# Patient Record
Sex: Male | Born: 1985 | Race: White | Hispanic: No | Marital: Single | Smoking: Current some day smoker
Health system: Southern US, Community
[De-identification: ages and names within clinical notes are randomized; demographics above are authoritative.]

## PROBLEM LIST (undated history)

## (undated) DIAGNOSIS — F1910 Other psychoactive substance abuse, uncomplicated: Secondary | ICD-10-CM

## (undated) HISTORY — PX: KIDNEY SURGERY: SHX687

## (undated) HISTORY — PX: HAND SURGERY: SHX662

---
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
## 2008-06-11 ENCOUNTER — Encounter: Payer: Self-pay | Admitting: Emergency Medicine

## 2008-06-11 ENCOUNTER — Inpatient Hospital Stay (HOSPITAL_COMMUNITY): Admission: EM | Admit: 2008-06-11 | Discharge: 2008-06-12 | Payer: Self-pay | Admitting: Urology

## 2008-07-11 ENCOUNTER — Ambulatory Visit (HOSPITAL_COMMUNITY): Admission: RE | Admit: 2008-07-11 | Discharge: 2008-07-11 | Payer: Self-pay | Admitting: Urology

## 2008-07-21 ENCOUNTER — Inpatient Hospital Stay (HOSPITAL_COMMUNITY): Admission: RE | Admit: 2008-07-21 | Discharge: 2008-07-23 | Payer: Self-pay | Admitting: Urology

## 2008-07-21 ENCOUNTER — Encounter (INDEPENDENT_AMBULATORY_CARE_PROVIDER_SITE_OTHER): Payer: Self-pay | Admitting: Urology

## 2008-07-27 ENCOUNTER — Emergency Department (HOSPITAL_COMMUNITY): Admission: EM | Admit: 2008-07-27 | Discharge: 2008-07-28 | Payer: Self-pay | Admitting: Emergency Medicine

## 2009-01-24 ENCOUNTER — Emergency Department (HOSPITAL_COMMUNITY): Admission: EM | Admit: 2009-01-24 | Discharge: 2009-01-25 | Payer: Self-pay | Admitting: Emergency Medicine

## 2009-03-01 ENCOUNTER — Emergency Department (HOSPITAL_COMMUNITY): Admission: EM | Admit: 2009-03-01 | Discharge: 2009-03-01 | Payer: Self-pay | Admitting: Emergency Medicine

## 2009-05-17 ENCOUNTER — Emergency Department (HOSPITAL_COMMUNITY): Admission: EM | Admit: 2009-05-17 | Discharge: 2009-05-18 | Payer: Self-pay | Admitting: Emergency Medicine

## 2009-05-30 ENCOUNTER — Ambulatory Visit (HOSPITAL_COMMUNITY): Admission: RE | Admit: 2009-05-30 | Discharge: 2009-05-30 | Payer: Self-pay | Admitting: Urology

## 2010-06-13 LAB — DIFFERENTIAL
Lymphocytes Relative: 29 % (ref 12–46)
Lymphs Abs: 2.6 10*3/uL (ref 0.7–4.0)
Monocytes Relative: 7 % (ref 3–12)
Neutrophils Relative %: 61 % (ref 43–77)

## 2010-06-13 LAB — CBC
HCT: 47 % (ref 39.0–52.0)
MCV: 92.5 fL (ref 78.0–100.0)
Platelets: 242 10*3/uL (ref 150–400)
RDW: 13.1 % (ref 11.5–15.5)

## 2010-06-13 LAB — URINALYSIS, ROUTINE W REFLEX MICROSCOPIC
Glucose, UA: NEGATIVE mg/dL
Hgb urine dipstick: NEGATIVE
Nitrite: NEGATIVE
Protein, ur: NEGATIVE mg/dL
Specific Gravity, Urine: 1.026 (ref 1.005–1.030)

## 2010-06-13 LAB — POCT I-STAT, CHEM 8
BUN: 15 mg/dL (ref 6–23)
Calcium, Ion: 1.1 mmol/L — ABNORMAL LOW (ref 1.12–1.32)
Chloride: 105 mEq/L (ref 96–112)
Creatinine, Ser: 0.9 mg/dL (ref 0.4–1.5)
HCT: 49 % (ref 39.0–52.0)
Hemoglobin: 16.7 g/dL (ref 13.0–17.0)
TCO2: 29 mmol/L (ref 0–100)

## 2010-06-26 LAB — BASIC METABOLIC PANEL
BUN: 11 mg/dL (ref 6–23)
Calcium: 9.6 mg/dL (ref 8.4–10.5)
Creatinine, Ser: 0.82 mg/dL (ref 0.4–1.5)
GFR calc non Af Amer: >60 mL/min (ref >60)
Glucose, Bld: 101 mg/dL — ABNORMAL HIGH (ref 70–99)

## 2010-06-26 LAB — URINALYSIS, ROUTINE W REFLEX MICROSCOPIC
Bilirubin Urine: NEGATIVE
Ketones, ur: NEGATIVE mg/dL
Protein, ur: NEGATIVE mg/dL

## 2010-06-26 LAB — URINE MICROSCOPIC-ADD ON

## 2010-06-27 LAB — CBC
HCT: 48.4 % (ref 39.0–52.0)
Hemoglobin: 16.3 g/dL (ref 13.0–17.0)
MCHC: 33.6 g/dL (ref 30.0–36.0)
MCV: 92.2 fL (ref 78.0–100.0)
Platelets: 227 10*3/uL (ref 150–400)

## 2010-06-27 LAB — POCT I-STAT, CHEM 8
Glucose, Bld: 109 mg/dL — ABNORMAL HIGH (ref 70–99)
Hemoglobin: 17 g/dL (ref 13.0–17.0)
TCO2: 25 mmol/L (ref 0–100)

## 2010-06-27 LAB — HEPATIC FUNCTION PANEL
ALT: 19 U/L (ref 0–53)
AST: 24 U/L (ref 0–37)
Albumin: 3.8 g/dL (ref 3.5–5.2)
Alkaline Phosphatase: 88 U/L (ref 39–117)
Total Protein: 7.1 g/dL (ref 6.0–8.3)

## 2010-06-27 LAB — URINALYSIS, ROUTINE W REFLEX MICROSCOPIC
Bilirubin Urine: NEGATIVE
Glucose, UA: NEGATIVE mg/dL
Hgb urine dipstick: NEGATIVE
Ketones, ur: NEGATIVE mg/dL
Urobilinogen, UA: 1 mg/dL (ref 0.0–1.0)
pH: 6 (ref 5.0–8.0)

## 2010-06-27 LAB — DIFFERENTIAL
Basophils Relative: 0 % (ref 0–1)
Lymphocytes Relative: 30 % (ref 12–46)

## 2010-07-01 ENCOUNTER — Emergency Department (HOSPITAL_COMMUNITY)
Admission: EM | Admit: 2010-07-01 | Discharge: 2010-07-02 | Disposition: A | Discharge status: Home / Self Care | Payer: Worker's Compensation | Attending: Emergency Medicine | Admitting: Emergency Medicine

## 2010-07-01 ENCOUNTER — Emergency Department (HOSPITAL_COMMUNITY): Payer: Worker's Compensation

## 2010-07-01 DIAGNOSIS — W261XXA Contact with sword or dagger, initial encounter: Secondary | ICD-10-CM | POA: Insufficient documentation

## 2010-07-01 DIAGNOSIS — S61209A Unspecified open wound of unspecified finger without damage to nail, initial encounter: Secondary | ICD-10-CM | POA: Insufficient documentation

## 2010-07-01 DIAGNOSIS — W260XXA Contact with knife, initial encounter: Secondary | ICD-10-CM | POA: Insufficient documentation

## 2010-07-01 DIAGNOSIS — Y990 Civilian activity done for income or pay: Secondary | ICD-10-CM | POA: Insufficient documentation

## 2010-07-01 DIAGNOSIS — Z79899 Other long term (current) drug therapy: Secondary | ICD-10-CM | POA: Insufficient documentation

## 2010-07-01 DIAGNOSIS — M79609 Pain in unspecified limb: Secondary | ICD-10-CM | POA: Insufficient documentation

## 2010-07-03 LAB — COMPREHENSIVE METABOLIC PANEL
AST: 193 U/L — ABNORMAL HIGH (ref 0–37)
Albumin: 3.6 g/dL (ref 3.5–5.2)
Calcium: 9.2 mg/dL (ref 8.4–10.5)
Chloride: 105 mEq/L (ref 96–112)
Creatinine, Ser: 0.87 mg/dL (ref 0.4–1.5)
GFR calc Af Amer: >60 mL/min (ref >60)
Total Bilirubin: 0.8 mg/dL (ref 0.3–1.2)
Total Protein: 6.4 g/dL (ref 6.0–8.3)

## 2010-07-03 LAB — DIFFERENTIAL
Eosinophils Relative: 3 % (ref 0–5)
Lymphocytes Relative: 22 % (ref 12–46)
Lymphs Abs: 1.9 10*3/uL (ref 0.7–4.0)
Monocytes Absolute: 0.7 10*3/uL (ref 0.1–1.0)
Monocytes Relative: 8 % (ref 3–12)

## 2010-07-03 LAB — URINALYSIS, ROUTINE W REFLEX MICROSCOPIC
Glucose, UA: NEGATIVE mg/dL
Ketones, ur: NEGATIVE mg/dL
Specific Gravity, Urine: 1.016 (ref 1.005–1.030)
Urobilinogen, UA: 1 mg/dL (ref 0.0–1.0)

## 2010-07-03 LAB — CBC
HCT: 41.2 % (ref 39.0–52.0)
Hemoglobin: 14.2 g/dL (ref 13.0–17.0)
Platelets: 218 10*3/uL (ref 150–400)
WBC: 8.7 10*3/uL (ref 4.0–10.5)

## 2010-07-03 LAB — URINE MICROSCOPIC-ADD ON

## 2010-07-04 LAB — CBC
HCT: 47.3 % (ref 39.0–52.0)
MCHC: 34.5 g/dL (ref 30.0–36.0)
MCV: 91.4 fL (ref 78.0–100.0)
Platelets: 198 10*3/uL (ref 150–400)

## 2010-07-04 LAB — BASIC METABOLIC PANEL
BUN: 11 mg/dL (ref 6–23)
CO2: 28 mEq/L (ref 19–32)
CO2: 30 mEq/L (ref 19–32)
Calcium: 8.8 mg/dL (ref 8.4–10.5)
Chloride: 103 mEq/L (ref 96–112)
Creatinine, Ser: 0.87 mg/dL (ref 0.4–1.5)
GFR calc Af Amer: >60 mL/min (ref >60)
GFR calc non Af Amer: >60 mL/min (ref >60)
Glucose, Bld: 111 mg/dL — ABNORMAL HIGH (ref 70–99)
Glucose, Bld: 98 mg/dL (ref 70–99)
Potassium: 4.3 mEq/L (ref 3.5–5.1)
Potassium: 4.6 mEq/L (ref 3.5–5.1)
Sodium: 140 mEq/L (ref 135–145)

## 2010-07-04 LAB — HEMOGLOBIN AND HEMATOCRIT, BLOOD: HCT: 41.5 % (ref 39.0–52.0)

## 2010-07-04 LAB — CREATININE, FLUID (PLEURAL, PERITONEAL, JP DRAINAGE): Creat, Fluid: 0.7 mg/dL

## 2010-07-04 LAB — ABO/RH: ABO/RH(D): O NEG

## 2010-07-05 LAB — URINALYSIS, ROUTINE W REFLEX MICROSCOPIC
Bilirubin Urine: NEGATIVE
Hgb urine dipstick: NEGATIVE
Nitrite: NEGATIVE
Protein, ur: 30 mg/dL — AB
Specific Gravity, Urine: 1.038 — ABNORMAL HIGH (ref 1.005–1.030)
Urobilinogen, UA: 1 mg/dL (ref 0.0–1.0)

## 2010-07-05 LAB — POCT I-STAT, CHEM 8
BUN: 20 mg/dL (ref 6–23)
Chloride: 104 mEq/L (ref 96–112)
Creatinine, Ser: 1.2 mg/dL (ref 0.4–1.5)
Glucose, Bld: 100 mg/dL — ABNORMAL HIGH (ref 70–99)
Potassium: 3.6 mEq/L (ref 3.5–5.1)
Sodium: 138 mEq/L (ref 135–145)

## 2010-07-05 LAB — DIFFERENTIAL
Basophils Absolute: 0 10*3/uL (ref 0.0–0.1)
Basophils Relative: 0 % (ref 0–1)
Eosinophils Absolute: 0 10*3/uL (ref 0.0–0.7)
Eosinophils Relative: 0 % (ref 0–5)
Monocytes Absolute: 0.6 10*3/uL (ref 0.1–1.0)
Monocytes Relative: 5 % (ref 3–12)
Neutro Abs: 8.7 10*3/uL — ABNORMAL HIGH (ref 1.7–7.7)

## 2010-07-05 LAB — URINE MICROSCOPIC-ADD ON

## 2010-07-05 LAB — CBC
HCT: 49.7 % (ref 39.0–52.0)
Hemoglobin: 17 g/dL (ref 13.0–17.0)
MCHC: 34.3 g/dL (ref 30.0–36.0)
MCV: 91.6 fL (ref 78.0–100.0)
RBC: 5.43 MIL/uL (ref 4.22–5.81)
RDW: 13.1 % (ref 11.5–15.5)

## 2010-08-07 NOTE — Discharge Summary (Signed)
[name], [name] NO.:  [id number]   MEDICAL RECORD NO.:  [id number]          PATIENT TYPE:  INP   LOCATION:  1406                         FACILITY:  [facility]   PHYSICIAN:  Heloise Purpura, MD      DATE OF BIRTH:  [birth year]   DATE OF ADMISSION:  07/21/2008  DATE OF DISCHARGE:  07/23/2008                               DISCHARGE SUMMARY   ADMISSION DIAGNOSIS:  Right ureteropelvic junction obstruction.   DISCHARGE DIAGNOSIS:  Right ureteropelvic junction obstruction.   PROCEDURES:  1. Cystoscopy.  2. Right retrograde pyelography.  3. Right ureteral stent change.  4. Right robotic assisted laparoscopic dismembered pyeloplasty.   HISTORY AND PHYSICAL:  For full details please see admission history and  physical.  Briefly, [name] is a 25 year old gentleman who presented  with right-sided flank pain and was found to have imaging studies  consistent with a ureteropelvic junction obstruction.  After discussion  regarding management options for treatment and undergoing a nuclear  medicine renal scan and indicating preserved right renal function, it  was decided to proceed with the above procedures.   HOSPITAL COURSE:  He was taken to the operating room and underwent the  above procedures on July 21, 2008.  He tolerated these procedures well  without complications.  He was able to be transferred to a regular  hospital room following recovery from anesthesia.  The night of surgery,  he was able to begin ambulating.  He did have more pain than would  typically be expected, although did require a lot of narcotic use even  prior to surgery which was felt to be related to his indwelling ureteral  stent.  He was maintained on a PCA and on postoperative day #1, remained  hemodynamically stable.   Laboratory studies demonstrated a hematocrit of 41.5 and a serum  creatinine of 0.86.  His diet was advanced as tolerated and he did begin  ambulating.  He had return of bowel  function on postoperative day #1 and  despite having excellent urine output had very little output from his  perinephric drain.  The fluid from this drain was checked for a  creatinine level found to be consistent with serum at 0.7.   By postoperative day #2, he was tolerating a regular diet and ambulating  without difficulty.  His pain was controlled with oral pain medication  and he was felt stable for discharge.  His perinephric drain was removed  prior to discharge.   DISPOSITION:  Home.   DISCHARGE MEDICATIONS:  He was given a prescription to take Percocet as  needed for pain.   DISCHARGE INSTRUCTIONS:  He was instructed to be ambulatory but told to  refrain from any heavy lifting, strenuous activity, or driving.  He was  instructed to resume his diet as before surgery.   FOLLOW UP:  He is scheduled to follow-up in approximately 2 weeks for  further postoperative evaluation.      Heloise Purpura, MD  Electronically Signed     LB/MEDQ  D:  07/24/2008  T:  07/25/2008  Job:  [id number]

## 2010-08-07 NOTE — Op Note (Signed)
NAMESTOCKTON, NUNLEY NO.:  [id number]   MEDICAL RECORD NO.:  [id number]          PATIENT TYPE:  INP   LOCATION:  1443                         FACILITY:  [facility]   PHYSICIAN:  Lindaann Slough, M.D.  DATE OF BIRTH:  [birth year]   DATE OF PROCEDURE:  06/12/2008  DATE OF DISCHARGE:                               OPERATIVE REPORT   PREOPERATIVE DIAGNOSES:  Right hydronephrosis, right ureteropelvic  junction obstruction.   POSTOPERATIVE DIAGNOSES:  Right hydronephrosis, right ureteropelvic  junction obstruction.   PROCEDURE:  Cystoscopy, right retrograde pyelogram and insertion of  double-J catheter.   SURGEON:  Danae Chen, M.D.   ANESTHESIA:  General.   INDICATIONS:  The patient is a 25 years old male who was seen in the  emergency room yesterday afternoon with a history of sudden onset of  severe right flank pain associated with nausea and vomiting.  CT scan  showed severe right hydronephrosis, normal ureter and no evidence of  ureteral stone.  The hydronephrosis is probably secondary to UPJ  obstruction.  He is scheduled today for cystoscopy, right retrograde  pyelogram and insertion of double-J stent.   The patient was identified by his wrist band and proper time-out was  taken.   Under general anesthesia, he was prepped and draped and placed in the  dorsal lithotomy position.  A panendoscope was inserted in the bladder.  The urethra is normal.  Prostatic urethra is normal.  The bladder mucosa  is normal.  There is no stone or tumor in the bladder.  The ureteral  orifices are in normal position and shape.   Retrograde pyelogram.  A cone-tip catheter was passed through the cystoscope into the right  ureteral orifice.  Contrast was then injected through the cone-tip  catheter.  The ureter appears normal; at the proximal ureter near the  UPJ,  there is an S deformity of the ureter, probably secondary to a  crossing vessel and the renal pelvis and calices are  markedly dilated.   The cone-tip catheter was then removed.  A sensor tip guidewire was  passed through the cystoscope and into the ureter and an open-ended  catheter was passed over the sensor tip guidewire all the way up to the  level of the S deformity of the ureter.  The sensor guidewire was  removed and contrast was then injected through the open-ended catheter.  The same anatomic changes of the ureter were also noted.  It was  difficult then to pass the sensor guidewire through the open-ended  catheter into the renal pelvis.  It kept kinking at the level of the  anatomic changes of the ureter.  A Glidewire was then passed through the  open-ended catheter and passed into the renal pelvis.  Then a #6-French  - 26 double-J catheter was passed over the Glidewire but could not be  advanced beyond the point of the kink of the ureter.  After several  attempts, it was then decided to place a number 4.8 [place] double-J  catheter which passed without difficulty into the upper ureter.  However, the 4.8  [place] 26 was too short.  It was removed and  replaced with a number of 4.8, 28 [place]. The proximal curl of the  double-J catheter is in the renal pelvis.  The distal curl is in the  bladder.  The bladder was then emptied and cystoscope and Glidewire  removed.   The patient tolerated the procedure well and left the OR in satisfactory  condition to post anesthesia care unit.      Lindaann Slough, M.D.  Electronically Signed     MN/MEDQ  D:  06/12/2008  T:  06/12/2008  Job:  [id number]

## 2010-08-07 NOTE — Op Note (Signed)
[name], [name] NO.:  [id number]   MEDICAL RECORD NO.:  [id number]          PATIENT TYPE:  INP   LOCATION:  0002                         FACILITY:  [facility]   PHYSICIAN:  Heloise Purpura, MD      DATE OF BIRTH:  06/18/1985   DATE OF PROCEDURE:  07/21/2008  DATE OF DISCHARGE:                               OPERATIVE REPORT   PREOPERATIVE DIAGNOSIS:  Right ureteropelvic junction obstruction.   POSTOPERATIVE DIAGNOSIS:  Right ureteropelvic junction obstruction.   PROCEDURE:  1. Cystoscopy.  2. Right retrograde pyelography.  3. Right ureteral stent change.  4. Attempted right ureteroscopy.  5. Right robotic assisted laparoscopic dismembered pyeloplasty.   SURGEON:  Heloise Purpura, MD   ASSISTANT:  Delia Chimes, nurse practitioner and Dr. Georgeanna Lea.   ANESTHESIA:  General.   COMPLICATIONS:  None.   ESTIMATED BLOOD LOSS:  50 mL.   INTRAVENOUS FLUIDS:  2100 mL of lactated Ringer's.   SPECIMENS:  Right renal pelvis.   DISPOSITION:  Specimen to pathology.   DRAINS:  1. #15 Blake perinephric drain.  2. 16-French Foley catheter.   INDICATION:  [name] is a 25 year old to gentleman who presented with  severe right-sided flank pain and was evaluated in the emergency  department.  He was taken to the operating room by Dr. Ezzie Dural and  found to have findings consistent with a right ureteropelvic junction  obstruction.  No stones or other pathology were identified on his CT  scan.  A ureteral stent was then placed and he subsequently underwent  nuclear medicine renography demonstrating preserved split renal function  of the right kidney.  We discussed options for treatment and he elected  to proceed with the above procedures.  The potential risks,  complications, and alternative options were discussed in detail and  informed consent was obtained.   DESCRIPTION OF PROCEDURE:  The patient was taken to the operating room  and a general anesthetic was  administered.  He was given preoperative  antibiotics, placed in the dorsal lithotomy position, prepped and draped  in the usual sterile fashion.  Next preoperative time-out was performed.  Cystourethroscopy was performed and the bladder was noted to be  unremarkable except for an indwelling right ureteral stent.  This was  brought out through the urethral meatus with the aid of the flexible  graspers and a 0.038 sensor guidewire was advanced through the stent and  up into the renal pelvis.  A 6-French ureteral catheter was then  advanced over the wire into the proximal ureter and contrast was  injected.  This demonstrated a tortuous proximal ureter consistent with  a probable crossing vessel.  There was noted to be some filling defects  within the proximal ureter, possibly related to edema although this was  not definitive.  The remainder of the renal pelvis appeared dilated  including the caliceal system without evidence of filling defects.  Due  to the abnormalities within the proximal ureter, it was decided to  perform ureteroscopy.  However, the flexible ureteroscope could not be  advanced into the distal ureter due to  the caliber of the distal ureter.  Based on the patient's age and the unlikelihood that he would have a  malignancy of the urothelium, it was decided to proceed with his  pyeloplasty and intraoperative exploration.  However, the most likely  cause of his filling defects in the proximal ureter was felt to be a  urothelial edema.  A 0.038 sensor guidewire was positioned up into the  renal pelvis under fluoroscopic guidance and an 8 x 28 double-J ureteral  stent was advanced over a wire using Seldinger technique.  It was  positioned within the renal pelvis and the bladder under fluoroscopic  and cystoscopic guidance and the wire was removed with a good curl noted  in the renal pelvis as well as in the bladder.  A 16-French Foley  catheter was placed.  The patient was then  repositioned in the right  modified flank position.  His abdomen was again prepped and draped in  the usual sterile fashion.  Another preoperative time-out was performed.  A site was selected just superior to the umbilicus for placement of the  camera port.  This was placed using a standard open Hassan technique  which allowed entry in the peritoneal entry into the peritoneal cavity  under direct vision without difficulty.  A 12 mm port was then placed  and a pneumoperitoneum established.  The 0 degrees lens was used to  inspect the abdomen and there was no evidence of any intra-abdominal  injuries or other abnormalities.  The remaining ports were then placed  with an 8 mm port placed in the right upper quadrant, right lower  quadrant, and far right lower quadrant.  An additional 12 mm port was  placed in the lower midline for laparoscopic assistance.  All ports were  placed under direct vision and without difficulty.  The surgical cart  was then docked.  With the aid of the cautery scissors, the white line  of Toldt was incised along the length of the ascending colon, allowing  the colon to be mobilized medially and the space between the mesocolon  and anterior layer of Gerota's fascia to be developed.  The duodenum was  identified and was released, thereby exposing the vena cava as the  duodenum was mobilized medially.  Once the retroperitoneum was exposed  and the ureter and gonadal vein were identified and were lifted  anteriorly off the psoas muscle.  The ureter was isolated with care to  preserve the periureteral blood supply.  Dissection then proceeded  superiorly and a crossing renal artery was identified extending toward  the lower pole of the right kidney.  The gonadal vein was isolated and  was divided between multiple 5-mm Hem-o-lok clips.  This allowed the  lower pole renal artery and the ureter to be isolated.  The right renal  pelvis was then carefully dissected free from  the surrounding  structures, allowing the renal pelvis and ureter to be freely mobile  above and below the crossing lower pole vessel.  At this point the  ureteropelvic junction was divided, allowing the renal pelvis and ureter  to be brought anterior to the crossing renal vessel.  The ureteropelvic  junction was then excised and the renal pelvis was spatulated medially.  There was noted to be a large dilated renal pelvis and it was felt that  reduction pyeloplasty was warranted.  Therefore excess renal pelvis  medially was excised and it was felt this would allow for a nice tapered  drainage  system.  The ureter was then spatulated laterally and a  reapproximation stitch was placed with a 4-0 Vicryl suture between the  lateral aspect of the renal pelvis and the lateral aspect of the ureter.  The ureteral stent was then placed back into the renal pelvis and the  excised portion of the renal pelvis was reapproximated with a running 4-  0 Vicryl suture.  The medial aspect of the ureter and new ureteropelvic  junction were then reapproximated with a 4-0 Vicryl suture in the  anterior and posterior aspects of the ureteropelvic junction anastomosis  were then reapproximated with running 4-0 Vicryl sutures respectively.  This appeared to result in tension free and watertight anastomosis.  Again the ureteral stent was appropriately positioned prior to  completion of the anastomosis.  A new #15 Blake drain was brought  through the far right lower quadrant port and positioned in the  perinephric space.  It was secured to skin with a nylon suture.  The  surgical cart was undocked.  The perinephric space was copiously  irrigated and hemostasis was ensured.  All remaining ports were removed  under direct vision and the 12 mm port sites in the lower midline and  just above the umbilicus were closed with figure-of-eight 0 Vicryl  sutures.  All port  sites were then injected with 0.25% Marcaine and  reapproximated at the  skin level with 4-0 Vicryl subcuticular sutures.  Dermabond was then  applied to the skin.  The patient tolerated procedure well without  complications.  He was able to be extubated and transferred to the  recovery unit in satisfactory condition.      Heloise Purpura, MD  Electronically Signed     LB/MEDQ  D:  07/21/2008  T:  07/21/2008  Job:  [id number]

## 2010-12-09 ENCOUNTER — Emergency Department (HOSPITAL_COMMUNITY)
Admission: EM | Admit: 2010-12-09 | Discharge: 2010-12-09 | Discharge status: Against Medical Advice | Payer: PRIVATE HEALTH INSURANCE | Attending: Emergency Medicine | Admitting: Emergency Medicine

## 2010-12-09 ENCOUNTER — Ambulatory Visit (HOSPITAL_COMMUNITY)
Admission: RE | Admit: 2010-12-09 | Discharge: 2010-12-09 | Disposition: A | Discharge status: Home / Self Care | Payer: Worker's Compensation | Source: Ambulatory Visit | Attending: Emergency Medicine | Admitting: Emergency Medicine

## 2010-12-09 DIAGNOSIS — R0602 Shortness of breath: Secondary | ICD-10-CM | POA: Insufficient documentation

## 2010-12-09 DIAGNOSIS — F411 Generalized anxiety disorder: Secondary | ICD-10-CM | POA: Insufficient documentation

## 2010-12-10 ENCOUNTER — Inpatient Hospital Stay (HOSPITAL_COMMUNITY)
Admission: EM | Admit: 2010-12-10 | Discharge: 2010-12-13 | DRG: 198 | Disposition: A | Discharge status: Home / Self Care | Payer: PRIVATE HEALTH INSURANCE | Source: Ambulatory Visit | Attending: Internal Medicine | Admitting: Internal Medicine

## 2010-12-10 DIAGNOSIS — Z87448 Personal history of other diseases of urinary system: Secondary | ICD-10-CM

## 2010-12-10 DIAGNOSIS — T380X5A Adverse effect of glucocorticoids and synthetic analogues, initial encounter: Secondary | ICD-10-CM | POA: Diagnosis present

## 2010-12-10 DIAGNOSIS — F3289 Other specified depressive episodes: Secondary | ICD-10-CM | POA: Diagnosis present

## 2010-12-10 DIAGNOSIS — F329 Major depressive disorder, single episode, unspecified: Secondary | ICD-10-CM | POA: Diagnosis present

## 2010-12-10 DIAGNOSIS — M549 Dorsalgia, unspecified: Secondary | ICD-10-CM | POA: Diagnosis present

## 2010-12-10 DIAGNOSIS — J679 Hypersensitivity pneumonitis due to unspecified organic dust: Principal | ICD-10-CM | POA: Diagnosis present

## 2010-12-10 DIAGNOSIS — F1120 Opioid dependence, uncomplicated: Secondary | ICD-10-CM | POA: Diagnosis present

## 2010-12-10 DIAGNOSIS — R7309 Other abnormal glucose: Secondary | ICD-10-CM | POA: Diagnosis present

## 2010-12-10 DIAGNOSIS — R000 Tachycardia, unspecified: Secondary | ICD-10-CM | POA: Diagnosis present

## 2010-12-10 DIAGNOSIS — M25539 Pain in unspecified wrist: Secondary | ICD-10-CM | POA: Diagnosis present

## 2010-12-10 DIAGNOSIS — G8929 Other chronic pain: Secondary | ICD-10-CM | POA: Diagnosis present

## 2010-12-10 DIAGNOSIS — F1210 Cannabis abuse, uncomplicated: Secondary | ICD-10-CM | POA: Diagnosis present

## 2010-12-10 LAB — DIFFERENTIAL
Basophils Absolute: 0 10*3/uL (ref 0.0–0.1)
Eosinophils Absolute: 0.5 10*3/uL (ref 0.0–0.7)
Eosinophils Relative: 6 % — ABNORMAL HIGH (ref 0–5)
Lymphocytes Relative: 25 % (ref 12–46)
Monocytes Absolute: 0.6 10*3/uL (ref 0.1–1.0)

## 2010-12-10 LAB — CBC
HCT: 43.4 % (ref 39.0–52.0)
MCHC: 34.8 g/dL (ref 30.0–36.0)
Platelets: 233 10*3/uL (ref 150–400)
RDW: 13 % (ref 11.5–15.5)
WBC: 8.7 10*3/uL (ref 4.0–10.5)

## 2010-12-10 LAB — RAPID URINE DRUG SCREEN, HOSP PERFORMED
Benzodiazepines: NOT DETECTED
Opiates: POSITIVE — AB

## 2010-12-10 LAB — BASIC METABOLIC PANEL
BUN: 13 mg/dL (ref 6–23)
Creatinine, Ser: 0.8 mg/dL (ref 0.50–1.35)
GFR calc Af Amer: >60 mL/min (ref >60)
GFR calc non Af Amer: >60 mL/min (ref >60)
Potassium: 3.5 mEq/L (ref 3.5–5.1)

## 2010-12-11 DIAGNOSIS — F1920 Other psychoactive substance dependence, uncomplicated: Secondary | ICD-10-CM

## 2010-12-11 LAB — CBC
HCT: 39.4 % (ref 39.0–52.0)
MCH: 30.2 pg (ref 26.0–34.0)
MCV: 87 fL (ref 78.0–100.0)
RBC: 4.53 MIL/uL (ref 4.22–5.81)
WBC: 14.1 10*3/uL — ABNORMAL HIGH (ref 4.0–10.5)

## 2010-12-11 LAB — BASIC METABOLIC PANEL
BUN: 15 mg/dL (ref 6–23)
CO2: 25 mEq/L (ref 19–32)
Chloride: 102 mEq/L (ref 96–112)
Creatinine, Ser: 0.67 mg/dL (ref 0.50–1.35)
Glucose, Bld: 181 mg/dL — ABNORMAL HIGH (ref 70–99)

## 2010-12-12 LAB — GLUCOSE, CAPILLARY
Glucose-Capillary: 113 mg/dL — ABNORMAL HIGH (ref 70–99)
Glucose-Capillary: 133 mg/dL — ABNORMAL HIGH (ref 70–99)
Glucose-Capillary: 164 mg/dL — ABNORMAL HIGH (ref 70–99)

## 2010-12-12 LAB — BASIC METABOLIC PANEL
CO2: 23 mEq/L (ref 19–32)
Chloride: 102 mEq/L (ref 96–112)
GFR calc non Af Amer: >60 mL/min (ref >60)
Glucose, Bld: 150 mg/dL — ABNORMAL HIGH (ref 70–99)
Potassium: 4.8 mEq/L (ref 3.5–5.1)
Sodium: 140 mEq/L (ref 135–145)

## 2010-12-13 LAB — CBC
HCT: 37.5 % — ABNORMAL LOW (ref 39.0–52.0)
MCH: 29.2 pg (ref 26.0–34.0)
MCHC: 33.1 g/dL (ref 30.0–36.0)
MCV: 88.4 fL (ref 78.0–100.0)
Platelets: 223 10*3/uL (ref 150–400)
RDW: 13.9 % (ref 11.5–15.5)
WBC: 15.3 10*3/uL — ABNORMAL HIGH (ref 4.0–10.5)

## 2010-12-13 LAB — BASIC METABOLIC PANEL
BUN: 21 mg/dL (ref 6–23)
Calcium: 8.9 mg/dL (ref 8.4–10.5)
Chloride: 105 mEq/L (ref 96–112)
Creatinine, Ser: 0.69 mg/dL (ref 0.50–1.35)
GFR calc Af Amer: >60 mL/min (ref >60)
GFR calc non Af Amer: >60 mL/min (ref >60)

## 2010-12-13 LAB — GLUCOSE, CAPILLARY: Glucose-Capillary: 102 mg/dL — ABNORMAL HIGH (ref 70–99)

## 2011-01-01 NOTE — H&P (Signed)
[name], [name] NO.:  [id number]  MEDICAL RECORD NO.:  [id number]  LOCATION:  [facility]                         FACILITY:  [facility]  PHYSICIAN:  Jeoffrey Massed, MD    DATE OF BIRTH:  05/22/1985  DATE OF ADMISSION:  12/10/2010 DATE OF DISCHARGE:                             HISTORY & PHYSICAL   PRIMARY CARE PRACTITIONER:  He does not have one.  CHIEF COMPLAINT:  Shortness of breath.  HISTORY OF PRESENT ILLNESS:  The patient is a 25 year old male with no significant medical issues, comes in with shortness of breath for the past 2 days.  Per the patient, the shortness of breath initially was mostly exertional and over the past couple of days has worsened to the extent that even minimum exertion is now causing him to have shortness of breath.  He claims that when he lie still and just lies in bed the shortness of breath is not that bad.  He also claims that for the past 1- 2 days he was having some runny nose and he claims that people at work said his right eye was read as well.  He denies any fever.  He does have mostly a dry cough.  The patient works in [employer] and works on [employer] as well.  He does not remember being exposed to any new chemical agents as well.  He unfortunately uses marijuana and abuses prescription narcotics which he gets off the street.  Upon further questioning, he claims that he crushes the OxyContin pills and either snorts them or injects them.  He claims that he does IVDA at least 2 times a week and has been doing this for the past 8 months or so primarily for pain control as the patient has had kidney problems as well as some cuts and bruises that he has been having problems with pain.  He actually presented to the ED yesterday and was discharged and came back today with persistent shortness of breath and the Hospitalist Service is now being asked to admit this patient for further evaluation.  During my evaluation, the patient  seems very comfortable, was not overtly short of breath.  I did walk him around the ED, and upon walking may be 300-400 feet, the patient did began feeling some mild shortness of breath and started to have some mild wheezing, but the patient was still able to easily talk in full sentences and was not using any of his accessory muscles of respiration.  ALLERGIES:  Denies any known allergies.  PAST MEDICAL HISTORY:  None.  PAST SURGICAL HISTORY:  The patient did have a history of right hydronephrosis and has had a history of having a cystoscopy, with a right retrograde pyelogram and insertion of a double-J catheter done in March 2010.  This was secondary to a right ureteropelvic junction obstruction.  Family history is noncontributory.  SOCIAL HISTORY:  The patient works in [employer], and as noted above in my HPI, the patient occasionally uses marijuana and abuses narcotics.  REVIEW OF SYSTEMS:  Detailed review of 12 systems were done and these are negative except for the ones noted in the HPI.  PHYSICAL  EXAMINATION:  VITAL SIGNS:  Temperature of 98.0, heart rate of 105, blood pressure 136/82, respirations of 22, and a pulse ox of 92% on room air. HEENT:  Atraumatic, normocephalic.  Pupils equally reactive to light and accommodation. NECK:  Supple.  No JVD. CHEST:  There is good air entry bilaterally; however, there is rhonchi heard in all lung zones. CARDIOVASCULAR:  Heart sounds are regular.  No murmurs heard. ABDOMEN:  Soft, nontender, nondistended. EXTREMITIES:  No edema. NEUROLOGIC:  The patient is awake, alert.  Speech is clear and he does not appear to have any focal neurological deficits on exam.  His cranial nerves II through XII are also intact.  LABORATORY DATA: 1. CBC shows WBC of 8.7, hemoglobin of 15.1, hematocrit of 43.4, and a     platelet count of 233. 2. Chemistries done today shows a sodium of 138, potassium of 3.5,     chloride of 99, bicarb of 30,  glucose of 99, BUN of 13, creatinine     of 0.80, and a calcium of 9.8.  RADIOLOGICAL STUDIES:  A chest x-ray, two-view shows no acute cardiopulmonary process.  ASSESSMENT: 1. Shortness of breath, not known whether this is hypersensitivity     pneumonitis or new diagnosis of bronchial asthma. 2. Marijuana abuse. 3. Narcotic abuse.  PLAN: 1. The patient will be admitted to regular bed. 2. He will be placed on IV Solu-Medrol and nebulized bronchodilators. 3. We will follow clinical course and reassess in the next 24-48     hours.  If he is not better, he might need a noncontrast CT of the     chest as well. 4. We will go ahead and order an HIV test.  The patient is also     agreeable for this. 5. DVT prophylaxis with Lovenox. 6. Code status.  Full code.  TOTAL TIME SPENT FOR ADMISSION:  40 minutes.     Jeoffrey Massed, MD     SG/MEDQ  D:  12/10/2010  T:  12/10/2010  Job:  [id number]  Electronically Signed by Jeoffrey Massed  on 01/01/2011 03:52:55 PM

## 2011-01-06 NOTE — Consult Note (Signed)
NAME:  [name], [name] NO.:  [id number]  MEDICAL RECORD NO.:  [id number]  LOCATION:                                 FACILITY:  PHYSICIAN:  Conni Slipper, MDDATE OF BIRTH:  [birth year]  DATE OF CONSULTATION:  12/11/2010 DATE OF DISCHARGE:                                CONSULTATION   [name] is a 25 year old single Caucasian male who was admitted to the [facility] with shortness of breath.  The patient has been suffering with opioid abuse and marijuana abuse.  Psychiatric consult was requested to manage drugs of abuse.  The patient reported he has been initially prescribed OxyContin pills by a physician for his kidney problems, he has a history of right hydronephrosis.  The patient reported he started using 2-1/2 years ago, initially 6 months prescribed medication, later he was craving for more drugs, not getting the pain relief and started using street drugs.  The patient reported he used OxyContin, oxymorphone, and every pain medication he can get.  He started getting the same effect by using IV drug abuse about 6-8 months ago.  The patient also reported he has friends who has been sharing together drug abuse and he also buy them and occasionally sells them. The patient has been working hard two different jobs out of [employer] and in town [employer].  The patient reported that he has no occupational-related problems.  He has no behavioral problems. He has no legal problems since he has been using drugs.  He also used marijuana since he was 75 years' old, he smokes two times a week.  His last smoking was 4 or 5 days ago.  His last use of IV drug was yesterday morning.  The patient was not sure if the shortness of breath was due to cold and cough or IV drug use.  The patient has no previous history of medical problems with his heart or lungs.  There is no history of head injuries, seizures, or motor vehicle  accidents.  PAST PSYCHIATRIC HISTORY:  The patient has no previous history of psychiatric illness, drug detox or rehab services in the past.  PSYCHOSOCIAL HISTORY:  The patient was born in [place], raised in [place], went to the [employer] where he started using smoking marijuana and later he went to 1 year [employer] which he was not completed.  The patient lives with his mother and sister who is 66 years old.  The patient's parents were separated.  The patient sees parents using marijuana once in awhile.  His sister used opioids when he gave it to her leftovers.  MENTAL STATUS:  The patient appeared as per his stated age in the hospital gown, poorly groomed, having IV line access available on his right side.  He was able to sit comfortably and talk freely without difficulty and distress.  He stated mood was somewhat depressed.  His affect was appropriate.  Speech was normal in rate, rhythm, and volume. He has no difficulties with his thought process.  He denied active suicidal ideations or homicidal ideations, intentions, or plan.  He has no evidence of hallucinations, delusions, or  paranoia.  He has fair to poor insight, judgment, and impulse control.  His lab indicated he was positive for opioids and marijuana as he indicated.  His glucose levels were higher than normal, probably medication effect/steroids.  ASSESSMENT:  AXIS I:  Opioid dependence, marijuana abuse. AXIS II:  None. AXIS III:  Shortness of breath, hypersensitivity pneumonitis, and history of right hydronephrosis. AXIS IV:  Problems with financial, dropped out of the [employer], no study relationship, working in two different jobs. The parents were separated. AXIS V:  Global assessment of functioning was 45-50.  TREATMENT PLAN:  The patient was again not having any symptoms of withdrawal of opioids at this time but he may get into cold, chills, fever, and diarrhea, blood  pressure fluctuations has he did in the past. I would recommend opioid or clonidine protocol for him and also recommend further psychosocial assessment and encouraging for the inpatient drug rehab upon medically cleared.  Thank you for the consult.     Conni Slipper, MD     JRJ/MEDQ  D:  12/11/2010  T:  12/12/2010  Job:  [id number]  Electronically Signed by Leata Mouse MD on 01/06/2011 11:57:58 AM
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
## 2011-02-01 ENCOUNTER — Ambulatory Visit: Payer: Self-pay | Admitting: Internal Medicine

## 2011-02-01 DIAGNOSIS — Z0289 Encounter for other administrative examinations: Secondary | ICD-10-CM

## 2011-04-17 ENCOUNTER — Emergency Department: Payer: Self-pay | Admitting: Emergency Medicine

## 2012-04-12 IMAGING — CR DG CHEST 2V
2 series · 2 of 2 positions shown · non-contrast
Comparison: 07/14/2008

CLINICAL DATA: Shortness of breath

CHEST - 2 VIEW

[w chest pa]
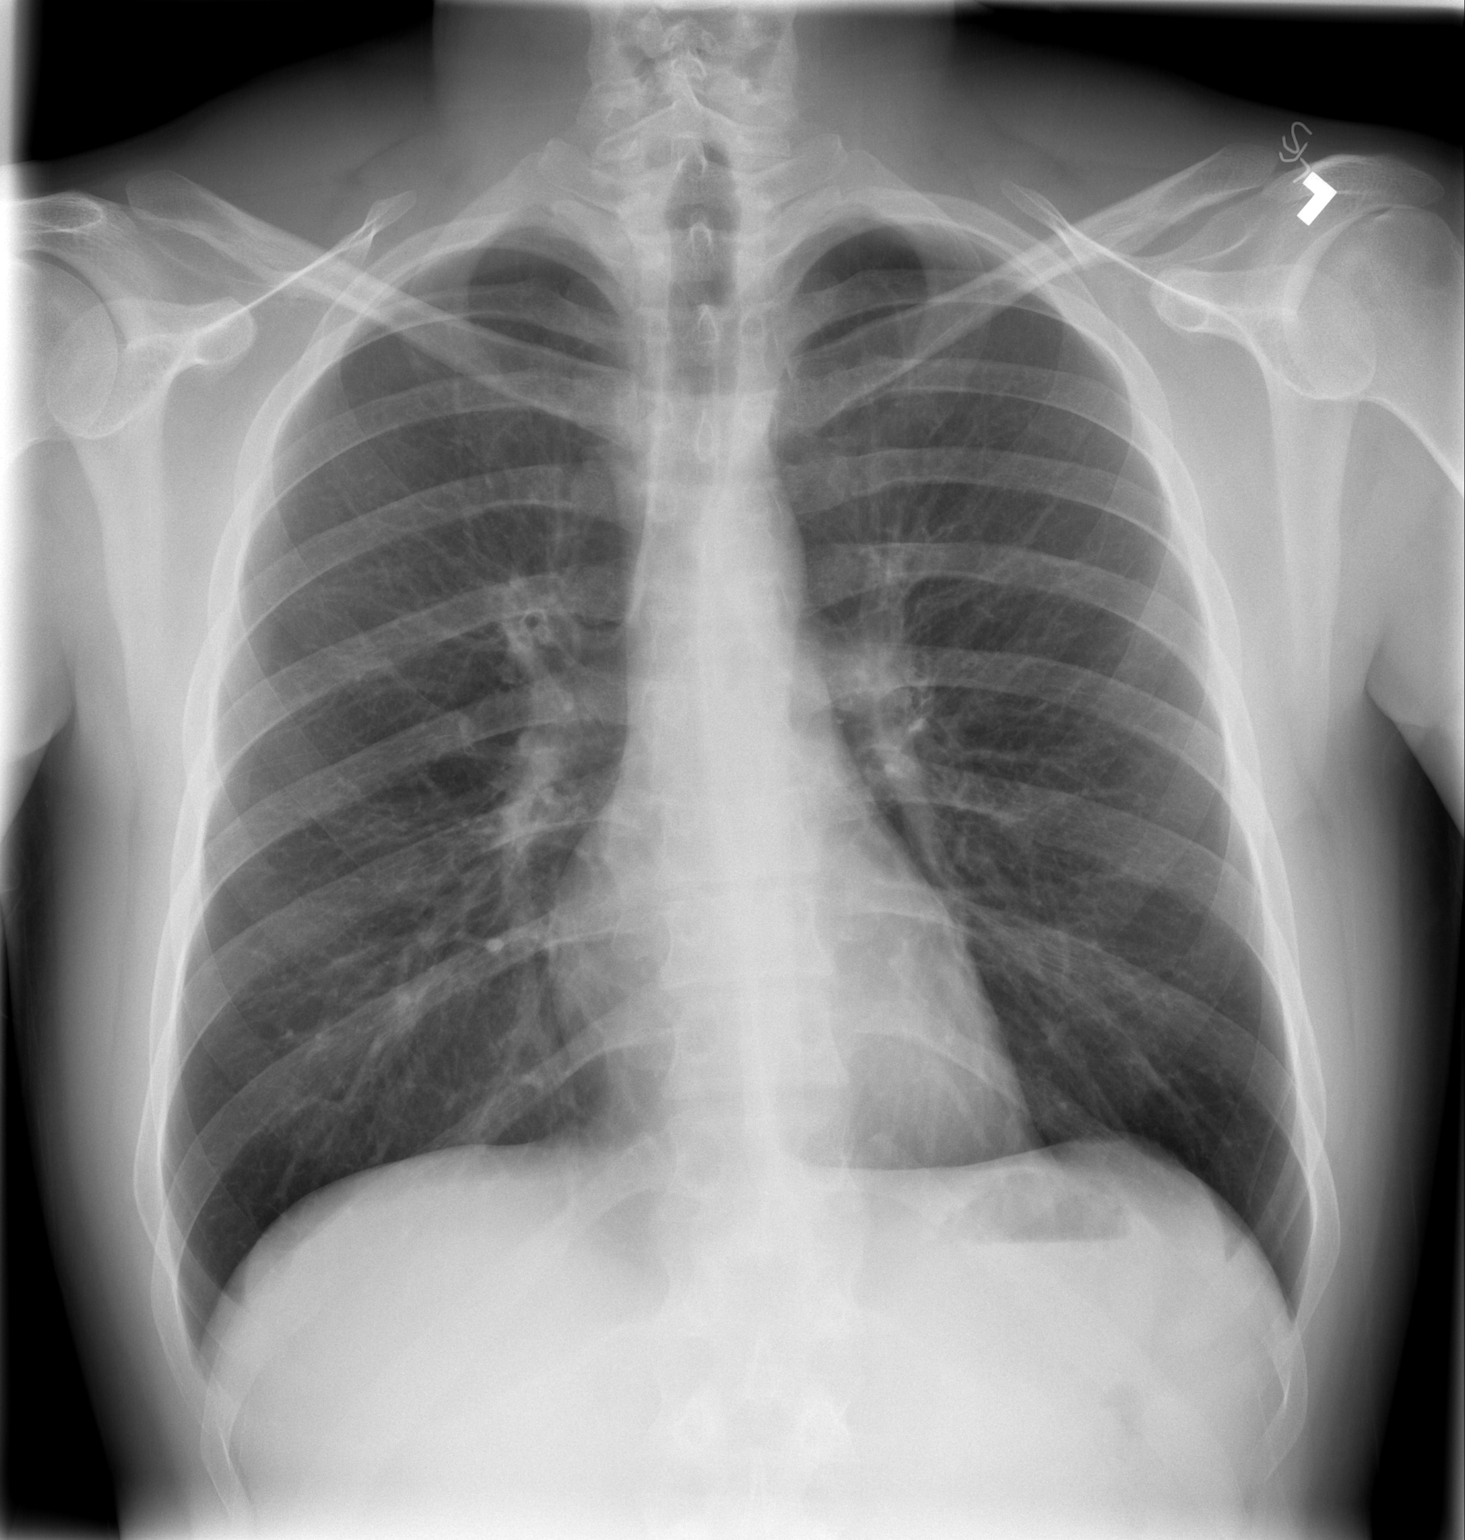

[w chest lat]
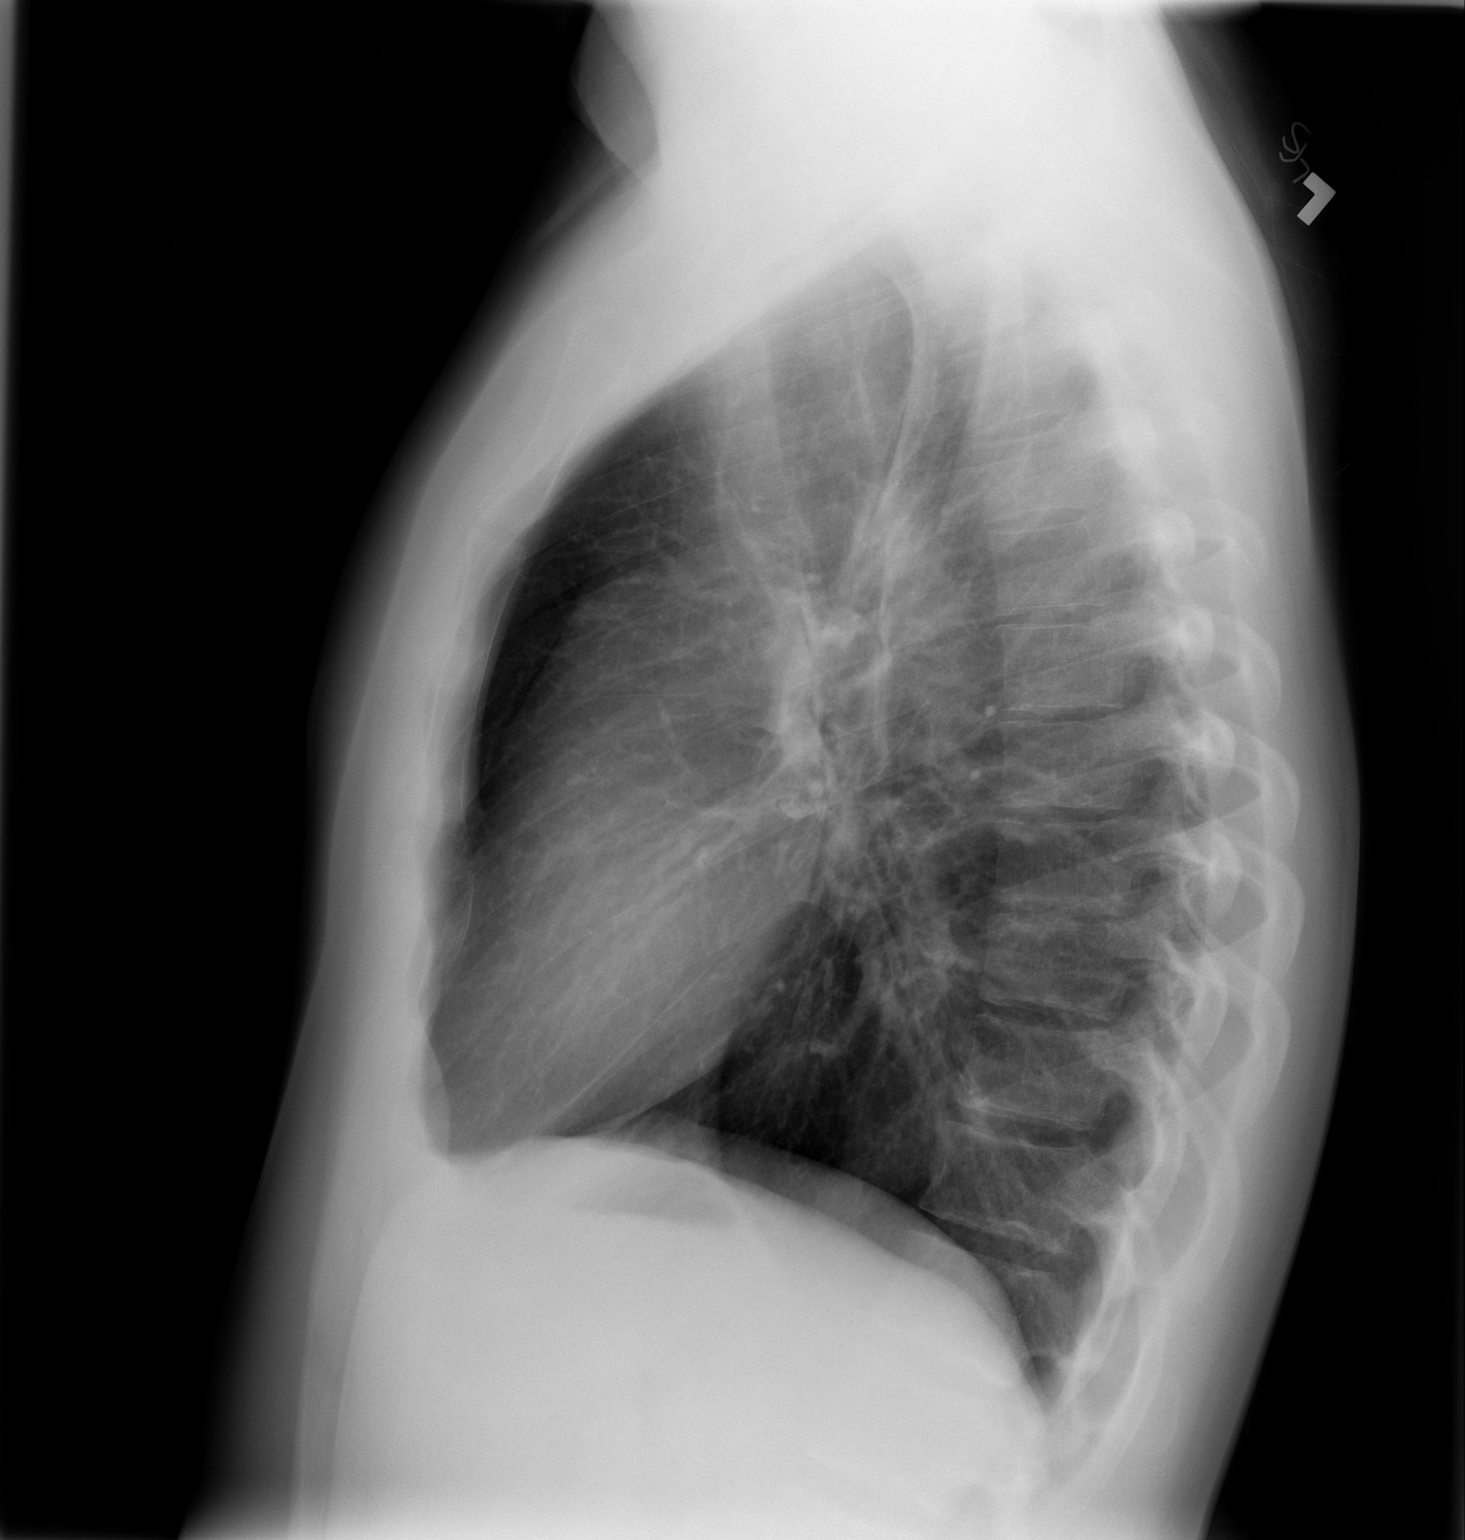

[2 of 2 positions shown; findings below may reference images not displayed]

FINDINGS: Lungs are clear. No pleural effusion or pneumothorax. The
cardiomediastinal contours are within normal limits. The visualized
bones and soft tissues are without significant appreciable
abnormality.
IMPRESSION: No acute cardiopulmonary process.

## 2012-11-06 ENCOUNTER — Encounter (HOSPITAL_COMMUNITY): Payer: Self-pay | Admitting: *Deleted

## 2012-11-06 ENCOUNTER — Emergency Department (HOSPITAL_COMMUNITY)
Admission: EM | Admit: 2012-11-06 | Discharge: 2012-11-06 | Disposition: A | Discharge status: Home / Self Care | Payer: Self-pay | Attending: Emergency Medicine | Admitting: Emergency Medicine

## 2012-11-06 ENCOUNTER — Emergency Department (HOSPITAL_COMMUNITY): Payer: Self-pay

## 2012-11-06 DIAGNOSIS — R05 Cough: Secondary | ICD-10-CM | POA: Insufficient documentation

## 2012-11-06 DIAGNOSIS — R0602 Shortness of breath: Secondary | ICD-10-CM | POA: Insufficient documentation

## 2012-11-06 DIAGNOSIS — R059 Cough, unspecified: Secondary | ICD-10-CM | POA: Insufficient documentation

## 2012-11-06 DIAGNOSIS — R062 Wheezing: Secondary | ICD-10-CM | POA: Insufficient documentation

## 2012-11-06 MED ORDER — PREDNISONE 20 MG PO TABS: 40.0000 mg | ORAL_TABLET | Freq: Every day | ORAL | Status: DC

## 2012-11-06 MED ORDER — ALBUTEROL (5 MG/ML) CONTINUOUS INHALATION SOLN
10.0000 mg/h | INHALATION_SOLUTION | RESPIRATORY_TRACT | Status: AC
  Administered 2012-11-06: 10 mg/h via RESPIRATORY_TRACT
  Filled 2012-11-06: qty 20

## 2012-11-06 MED ORDER — METHYLPREDNISOLONE SODIUM SUCC 125 MG IJ SOLR
125.0000 mg | Freq: Once | INTRAMUSCULAR | Status: AC
  Administered 2012-11-06: 125 mg via INTRAMUSCULAR
  Filled 2012-11-06: qty 2

## 2012-11-06 MED ORDER — ALBUTEROL SULFATE (5 MG/ML) 0.5% IN NEBU
5.0000 mg | INHALATION_SOLUTION | Freq: Once | RESPIRATORY_TRACT | Status: AC
  Administered 2012-11-06: 5 mg via RESPIRATORY_TRACT
  Filled 2012-11-06: qty 1

## 2012-11-06 MED ORDER — ALBUTEROL SULFATE HFA 108 (90 BASE) MCG/ACT IN AERS
1.0000 | INHALATION_SPRAY | RESPIRATORY_TRACT | Status: DC | PRN
  Administered 2012-11-06: 2 via RESPIRATORY_TRACT
  Filled 2012-11-06: qty 6.7

## 2012-11-06 MED ORDER — ALBUTEROL SULFATE (5 MG/ML) 0.5% IN NEBU
INHALATION_SOLUTION | RESPIRATORY_TRACT | Status: AC
  Filled 2012-11-06: qty 1

## 2012-11-06 MED ORDER — PENICILLIN V POTASSIUM 500 MG PO TABS: 500.0000 mg | ORAL_TABLET | Freq: Four times a day (QID) | ORAL | Status: DC

## 2012-11-06 MED ORDER — IPRATROPIUM BROMIDE 0.02 % IN SOLN
0.5000 mg | Freq: Once | RESPIRATORY_TRACT | Status: AC
  Administered 2012-11-06: 0.5 mg via RESPIRATORY_TRACT
  Filled 2012-11-06: qty 2.5

## 2012-11-06 NOTE — ED Provider Notes (Signed)
CSN: [id number]     Arrival date & time 11/06/12  1546 History    This chart was scribed for Emilia Beck, PA working with Shon Baton, MD by Quintella Reichert, ED Scribe. This patient was seen in room TR08C/TR08C and the patient's care was started at 4:50 PM.     Chief Complaint  Patient presents with  . Shortness of Breath    Patient is a 27 y.o. male presenting with shortness of breath. The history is provided by the patient. No language interpreter was used.  Shortness of Breath Severity:  Moderate Onset quality:  Gradual Duration:  3 days Progression:  Worsening Chronicity:  Recurrent (similar but less persistent episode 2 months ago) Context comment:  Works in [employer], remodeling a garage Relieved by:  None tried Worsened by:  Nothing tried Ineffective treatments:  None tried Associated symptoms: cough and wheezing   Associated symptoms: no fever and no vomiting     HPI Comments: SUMNER KIRCHMAN is a 27 y.o. male who presents to the Emergency Department complaining of 3 days of progressively-worsening SOB with associated wheezing and dry cough.  Pt denies h/o asthma.  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  He also notes that that he experienced a similar episode of SOB after working in concrete dust 2 months ago which resolved on its own.  He denies fever, nausea, vomiting, or any other associated symptoms.  He was given nebulizer treatment in ED prior to evaluation, with some relief.  Presently he states he is mildly SOB.     History reviewed. No pertinent past medical history.   No past surgical history on file.   No family history on file.   History  Substance Use Topics  . Smoking status: Not on file  . Smokeless tobacco: Not on file  . Alcohol Use: Not on file     Review of Systems  Constitutional: Negative for fever.  Respiratory: Positive for cough, shortness of breath and  wheezing.   Gastrointestinal: Negative for vomiting.  All other systems reviewed and are negative.      Allergies  Review of patient's allergies indicates no known allergies.  Home Medications   Current Outpatient Rx  Name  Route  Sig  Dispense  Refill  . ibuprofen (ADVIL,MOTRIN) 200 MG tablet   Oral   Take 800 mg by mouth every 6 (six) hours as needed for pain.           BP 148/84  Pulse 83  Temp(Src) 98.1 F (36.7 C) (Oral)  Resp 18  SpO2 99%  Physical Exam  Nursing note and vitals reviewed. Constitutional: He is oriented to person, place, and time. He appears well-developed and well-nourished. No distress.  HENT:  Head: Normocephalic and atraumatic.  Eyes: EOM are normal.  Neck: Neck supple. No tracheal deviation present.  Cardiovascular: Normal rate, regular rhythm and normal heart sounds.   No murmur heard. Pulmonary/Chest: Effort normal. No respiratory distress. He has wheezes. He has no rales.  Moderate wheezes noted throughout bilateral lung fields, more notable in upper lobes.  Musculoskeletal: Normal range of motion.  Neurological: He is alert and oriented to person, place, and time.  Skin: Skin is warm and dry.  Psychiatric: He has a normal mood and affect. His behavior is normal.    ED Course  Procedures (including critical care time)  DIAGNOSTIC STUDIES: Oxygen Saturation is 99% on room air, normal  by my interpretation.    COORDINATION OF CARE: 4:54 PM-Discussed treatment plan which includes breathing treatment with pt at bedside and pt agreed to plan.    Labs Reviewed - No data to display   Dg Chest 2 View  11/06/2012   *RADIOLOGY REPORT*  Clinical Data: Shortness of breath  CHEST - 2 VIEW  Comparison: December 09, 2010  Findings: Lungs clear.  Heart size and pulmonary vascularity are normal.  No adenopathy.  No bone lesions.  IMPRESSION: No abnormality noted.   Original Report Authenticated By: Bretta Bang, M.D.    1. SOB (shortness  of breath)      MDM  5:37 PM Chest xray unremarkable for acute changes. Patient given albuterol nebulizer treatment with some relief. Patient continues to wheeze. Patient will have another albuterol nebulizer treatment and IM solumedrol. I will discharge the patient with prednisone and an albuterol inhaler. Vitals stable and patient afebrile. Patient instructed to return with worsening or concerning symptoms.    I personally performed the services described in this documentation, which was scribed in my presence. The recorded information has been reviewed and is accurate.    Emilia Beck, PA-C 11/06/12 2322

## 2012-11-06 NOTE — ED Notes (Signed)
Patient is alert and orientedx4.  Patient was explained discharge instructions and they understood them with no questions.   

## 2012-11-06 NOTE — ED Notes (Signed)
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

## 2012-11-07 NOTE — ED Provider Notes (Signed)
Medical screening examination/treatment/procedure(s) were performed by non-physician practitioner and as supervising physician I was immediately available for consultation/collaboration.  Courtney F Horton, MD 11/07/12 0810 

## 2013-05-24 ENCOUNTER — Emergency Department (HOSPITAL_COMMUNITY)
Admission: EM | Admit: 2013-05-24 | Discharge: 2013-05-25 | Disposition: A | Discharge status: Home / Self Care | Payer: Self-pay | Attending: Emergency Medicine | Admitting: Emergency Medicine

## 2013-05-24 ENCOUNTER — Encounter (HOSPITAL_COMMUNITY): Payer: Self-pay | Admitting: Emergency Medicine

## 2013-05-24 DIAGNOSIS — Y92009 Unspecified place in unspecified non-institutional (private) residence as the place of occurrence of the external cause: Secondary | ICD-10-CM | POA: Insufficient documentation

## 2013-05-24 DIAGNOSIS — T40601A Poisoning by unspecified narcotics, accidental (unintentional), initial encounter: Secondary | ICD-10-CM

## 2013-05-24 DIAGNOSIS — R079 Chest pain, unspecified: Secondary | ICD-10-CM | POA: Insufficient documentation

## 2013-05-24 DIAGNOSIS — Y939 Activity, unspecified: Secondary | ICD-10-CM | POA: Insufficient documentation

## 2013-05-24 DIAGNOSIS — T400X1A Poisoning by opium, accidental (unintentional), initial encounter: Secondary | ICD-10-CM | POA: Insufficient documentation
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
## 2013-05-25 ENCOUNTER — Emergency Department (HOSPITAL_COMMUNITY): Payer: Worker's Compensation

## 2013-05-25 LAB — CBC
HEMATOCRIT: 43.4 % (ref 39.0–52.0)
HEMOGLOBIN: 14.5 g/dL (ref 13.0–17.0)
MCH: 30 pg (ref 26.0–34.0)
MCHC: 33.4 g/dL (ref 30.0–36.0)
MCV: 89.9 fL (ref 78.0–100.0)
Platelets: 214 10*3/uL (ref 150–400)
RBC: 4.83 MIL/uL (ref 4.22–5.81)
RDW: 14.4 % (ref 11.5–15.5)
WBC: 11.8 10*3/uL — AB (ref 4.0–10.5)

## 2013-05-25 LAB — COMPREHENSIVE METABOLIC PANEL
ALK PHOS: 83 U/L (ref 39–117)
ALT: 28 U/L (ref 0–53)
AST: 28 U/L (ref 0–37)
Albumin: 3.9 g/dL (ref 3.5–5.2)
BUN: 16 mg/dL (ref 6–23)
CO2: 27 meq/L (ref 19–32)
Calcium: 9.1 mg/dL (ref 8.4–10.5)
Chloride: 102 mEq/L (ref 96–112)
Creatinine, Ser: 1.01 mg/dL (ref 0.50–1.35)
GFR calc non Af Amer: >90 mL/min (ref >90)
GLUCOSE: 99 mg/dL (ref 70–99)
POTASSIUM: 4 meq/L (ref 3.7–5.3)
SODIUM: 141 meq/L (ref 137–147)
TOTAL PROTEIN: 7.4 g/dL (ref 6.0–8.3)
Total Bilirubin: 0.6 mg/dL (ref 0.3–1.2)

## 2013-05-25 LAB — SALICYLATE LEVEL: Salicylate Lvl: <2 mg/dL — ABNORMAL LOW (ref 2.8–20.0)

## 2013-05-25 LAB — RAPID URINE DRUG SCREEN, HOSP PERFORMED
Amphetamines: NOT DETECTED
BARBITURATES: NOT DETECTED
BENZODIAZEPINES: NOT DETECTED
Cocaine: POSITIVE — AB
Opiates: POSITIVE — AB
TETRAHYDROCANNABINOL: POSITIVE — AB

## 2013-05-25 LAB — ACETAMINOPHEN LEVEL: Acetaminophen (Tylenol), Serum: <15 ug/mL (ref 10–30)

## 2013-05-25 MED ORDER — SODIUM CHLORIDE 0.9 % IV BOLUS (SEPSIS)
1000.0000 mL | Freq: Once | INTRAVENOUS | Status: AC
  Administered 2013-05-25: 1000 mL via INTRAVENOUS

## 2013-05-25 NOTE — Discharge Instructions (Signed)
You are welcome to return to the ER if you decide to change your mind and speak with the crisis workers regarding drug treatment.   Narcotic Overdose A narcotic overdose is the misuse or overuse of a narcotic drug. A narcotic overdose can make you pass out and stop breathing. If you are not treated right away, this can cause permanent brain damage or stop your heart. Medicine may be given to reverse the effects of an overdose. If so, this medicine may bring on withdrawal symptoms. The symptoms may be abdominal cramps, throwing up (vomiting), sweating, chills, and nervousness. Injecting narcotics can cause more problems than just an overdose. AIDS, hepatitis, and other very serious infections are transmitted by sharing needles and syringes. If you decide to quit using, there are medicines which can help you through the withdrawal period. Trying to quit all at once on your own can be uncomfortable, but not life-threatening. Call your caregiver, Narcotics Anonymous, or any drug and alcohol treatment program for further help.  Document Released: 04/18/2004 Document Revised: 06/03/2011 Document Reviewed: 02/10/2009 [facility] Patient Information 2014 [employer], [place].    Emergency Department Resource Guide 1) Find a Doctor and Pay Out of Pocket Although you won't have to find out who is covered by your insurance plan, it is a good idea to ask around and get recommendations. You will then need to call the office and see if the doctor you have chosen will accept you as a new patient and what types of options they offer for patients who are self-pay. Some doctors offer discounts or will set up payment plans for their patients who do not have insurance, but you will need to ask so you aren't surprised when you get to your appointment.  2) Contact Your Local Health Department Not all health departments have doctors that can see patients for sick visits, but many do, so it is worth a call to see if yours does. If  you don't know where your local health department is, you can check in your phone book. The CDC also has a tool to help you locate your state's health department, and many state websites also have listings of all of their local health departments.  3) Find a Walk-in Clinic If your illness is not likely to be very severe or complicated, you may want to try a walk in clinic. These are popping up all over the country in pharmacies, drugstores, and shopping centers. They're usually staffed by nurse practitioners or physician assistants that have been trained to treat common illnesses and complaints. They're usually fairly quick and inexpensive. However, if you have serious medical issues or chronic medical problems, these are probably not your best option.  No Primary Care Doctor: - Call Health Connect at  [phone] - they can help you locate a primary care doctor that  accepts your insurance, provides certain services, etc. - Physician Referral Service- [phone]  Chronic Pain Problems: Organization         Address  Phone   Notes  [facility]  [phone] Patients need to be referred by their primary care doctor.   Medication Assistance: Organization         Address  Phone   Notes  [facility] [address] [place], [place] [phone] --Must be a resident of [facility] [place] -- Must have NO insurance coverage whatsoever (no Medicaid/ Medicare, etc.) -- The pt. MUST have a primary care doctor that  directs their care regularly and follows them in the community   [facility]ist  [phone]   [employer]  [phone]    Agencies that provide inexpensive medical care: Organization         Address  Phone   Notes  [facility]  [phone]   [facility]    [phone]   [facility] [address] [place], [place] [address] [phone]   [facility] [place] [address] [place], [place] [phone]   Planned Parenthood    [phone]   [facility]    [phone]   [facility] and [facility]  [address], [place] Phone:  [phone], Fax:  [phone] Hours of Operation:  9 am - 6 pm, M-F.  Also accepts Medicaid/Medicare and self-pay.  [facility]  [address], [place] Phone: [phone], Fax: [phone]. Hours of Operation:  8:30 am - 5:30 pm, M-F.  Also accepts Medicaid and self-pay.  [facility] [place] [address], [place] Phone: [phone]   [facility] [address] Natasha Bence [place], [place] [phone], Ext. [phone] Mondays & Thursdays: 7-9 AM.  First 15 patients are seen on a first come, first serve basis.    Medicaid-accepting [facility] Providers:  Organization         Address  Phone   Notes  [facility] [address], [place] [phone] Also accepts self-pay patients.  [facility] [address] Laurell Josephs [place], [place]  [phone]   [facility] [address], [place] [phone]   [facility] [address] [place] [phone]   Renaye Rakers [address], [place]   [phone] Only accepts [place] Access [place] patients after they have their name applied to their card.   Self-Pay (no insurance) in [facility]:  Organization         Address  Phone   Notes  Sickle Cell Patients, [facility] [address] [place], [place] [phone]   [facility] [address] [place], [place] [phone]   [facility] [place]  [address], [place] [phone]   [facility]/Dr. Osei-Bonsu  [address] [place] or [address], [place] [phone]  Phone number for both [place] and [place] locations is the same.  [facility] [address] [place] [phone]   [facility] [address], [place] or [address] [phone]   [facility] [address] [place] [phone], phone; [phone], fax Sees patients 1st and 3rd Saturday of every month.  Must not qualify for public or private insurance (i.e. Medicaid, Medicare, [place] Health Choice, Veterans' Benefits)  Household income should be no more than 200% of the poverty level The clinic cannot treat you if you are pregnant or think you are pregnant  Sexually transmitted diseases are not treated at the clinic.    Dental Care: Organization         Address  Phone  Notes  [facility] Department of [facility] [address] [place], [place] [phone] Accepts children up  to age 10 who are enrolled in Medicaid or [place] Health Choice; pregnant women with a Medicaid card; and children who have applied for Medicaid or [place] Health Choice, but were declined, whose parents can pay a reduced fee at time of service.  [facility] of [facility]  [address], [place] [phone] Accepts children up to age 77 who are enrolled in [place] or [place] Health Choice; pregnant women with a Medicaid card; and children who have applied for Medicaid or  Health Choice, but were declined, whose parents can pay a reduced fee at time of service.  [facility]  [address] [place], [place] [phone] Patients are seen by appointment only. Walk-ins are not accepted. [facility] will see patients 92 years of age and older. Monday - Tuesday (8am-5pm) Most Wednesdays (8:30-5pm) $30 per visit, cash only  [facility] Adult Dental Access PROGRAM  [address], [facility] [phone] Patients are seen by appointment  only. Walk-ins are not accepted. [facility] will see patients 18 years of age and older. One Wednesday Evening (Monthly: Volunteer Based).  $30 per visit, cash only  [employer]  [phone] for adults; Children under age 57, call Graduate Pediatric Dentistry at [phone]. Children aged 66-14, please call [phone] to request a pediatric application.  Dental services are provided in all areas of dental care including fillings, crowns and bridges, complete and partial dentures, implants, gum treatment, root canals, and extractions. Preventive care is also provided. Treatment is provided to both adults and children. Patients are selected via a lottery and there is often a waiting list.   [facility] [address] [place]  [phone] [email or web address]   [facility] [address] [place], [place] [phone], Ext. [phone] Second and Fourth Thursday of each month, opens at 6:30 AM; Clinic ends at 9 AM.  Patients are seen on a first-come first-served basis, and a limited number are seen during each clinic.   [facility]  [address] Ether Griffins [place], [place] [phone]   Eligibility Requirements You must have lived in [place], [place], or [place] counties for at least the last three months.   You cannot be eligible for state or federal sponsored [place], including [employer], [place], or [employer].   You generally cannot be eligible for healthcare insurance through your employer.    How to apply: Eligibility screenings are held every Tuesday and Wednesday afternoon from 1:00 pm until 4:00 pm. You do not need an appointment for the interview!  [facility] [address] [place], [place] [phone]   [facility]  [phone]   [facility] Health Department  [phone]   [facility] Department  [phone]    Behavioral Health  Resources in the Community: Intensive Outpatient Programs Organization         Address  Phone  Notes  [facility] [address] [place], [place] [phone]   [facility] [address], [place], [place] [phone]   [facility] [address] [place], [place]  [phone]   [facility] [address],  [place], [place] [phone] or [phone]   Substance Abuse Resources Organization         Address  Phone  Notes  Alcohol and Drug Services  [phone]   [facility]  [phone]   [facility] [place]  [phone]   Floydene Flock  [phone]   Residential & Outpatient Substance Abuse Program  [phone]   Psychological Services Organization         Address  Phone  Notes  [facility] Behavioral Health  [phone]   [facility] Services  [phone]   [facility] Mental Health [address] [place] [phone] or [phone]    Mobile Crisis Teams Organization         Address  Phone  Notes  [facility], Mobile Crisis Care Unit  [phone]   [facility]  [address] [place], [place] [phone]   Doristine Locks [address] [place] [phone]    Self-Help/Support Groups Organization         Address  Phone             Notes  [facility] [place] - variety of support groups  336- I7437963 Call for more information  Narcotics Anonymous (NA), [facility] [address], [employer] [place]  2 meetings at this location   Statistician         Address  Phone  Notes  [facility] [address],    [place]  [phone]   [facility]  [address], [place] [address], [place], [place] [phone]   [facility] [address] [place], [place] [phone] Admissions: 8am-3pm M-F  [facility] [address],    [place], [place] [phone]   [facility] [address] [place], [place], [place] [phone]   [facility] [address],  [place], [place] [phone]   [facility] - Intensive Outpatient [address] Laurell Josephs [address], [place], [place] [phone]   [facility] (Addiction Recovery [facility] [address] [place].,  [place], [place] [phone] or [phone]   [facility] ([facility]) [address], [place], [place] [phone] Accepts Medicaid  [facility] [address],  [place] [phone] Substance Abuse/Addiction Treatment   [facility] Organization         Address  Phone  Notes  [facility]  [phone]   Angie Fava, PhD [address] Ervin Knack [place], [place]   [phone] or [phone]   [facility] Behavioral   [address] [place], [place] [phone]   [facility] [address] [place], [place] [phone] Insurance/Medicaid/sponsorship through [facility] and Families [address]                                    [place], [place] [phone] Therapy/tele-psych/case  [facility] [place] [address], [place] [phone]    Dr. Lolly Mustache  [phone]   [facility]  [employer] [facility] Dept. 1) [address] [place] 2) [address] [place] 3)  [address], [place] [phone]  [phone]   [facility] Child Abuse Hotline (725) 135-0553 or [phone] (After Hours)

## 2013-05-25 NOTE — ED Provider Notes (Signed)
CSN: [id number]     Arrival date & time 05/24/13  2334 History   First MD Initiated Contact with Patient 05/25/13 0150     Chief Complaint  Patient presents with  . Drug Overdose     (Consider location/radiation/quality/duration/timing/severity/associated sxs/prior Treatment) HPI Comments: Patient is a 28 year old male with history of drug abuse. He presents today after an apparent opioid overdose at home. He admits to injecting Opana which he purchased on [employer] Shortly after injecting this he became unconscious and apneic. His sister called 911 and he was given chest compressions until EMS arrived. He was then given Narcan and woke up. He is complaining of discomfort in the front of his chest but otherwise has no complaints.  Patient is a 28 y.o. male presenting with Overdose. The history is provided by the patient.  Drug Overdose This is a new problem. The current episode started less than 1 hour ago. The problem occurs constantly. The problem has been resolved. Associated symptoms include chest pain. Pertinent negatives include no shortness of breath. Nothing aggravates the symptoms. Nothing relieves the symptoms. He has tried nothing for the symptoms. The treatment provided no relief.    History reviewed. No pertinent past medical history. Past Surgical History  Procedure Laterality Date  . Kidney surgery    . Hand surgery      L hand   No family history on file. History  Substance Use Topics  . Smoking status: Never Smoker   . Smokeless tobacco: Not on file  . Alcohol Use: Yes     Comment: occasionally    Review of Systems  Respiratory: Negative for shortness of breath.   Cardiovascular: Positive for chest pain.  All other systems reviewed and are negative.      Allergies  Review of patient's allergies indicates no known allergies.  Home Medications  No current outpatient prescriptions on file. BP 141/93  Pulse 106  Temp(Src) 99.1 F (37.3 C) (Oral)  Resp  13  SpO2 96% Physical Exam  Nursing note and vitals reviewed. Constitutional: He is oriented to person, place, and time. He appears well-developed and well-nourished. No distress.  HENT:  Head: Normocephalic and atraumatic.  Mouth/Throat: Oropharynx is clear and moist.  Eyes: Pupils are equal, round, and reactive to light.  Neck: Normal range of motion. Neck supple.  Cardiovascular: Normal rate, regular rhythm and normal heart sounds.   No murmur heard. Pulmonary/Chest: Effort normal and breath sounds normal. No respiratory distress. He has no wheezes. He exhibits tenderness.  There is tenderness to palpation of the anterior chest wall.  Abdominal: Soft. Bowel sounds are normal. He exhibits no distension. There is no tenderness.  Musculoskeletal: Normal range of motion. He exhibits no edema.  Lymphadenopathy:    He has no cervical adenopathy.  Neurological: He is alert and oriented to person, place, and time. No cranial nerve deficit. He exhibits normal muscle tone. Coordination normal.  Skin: Skin is warm and dry. He is not diaphoretic.    ED Course  Procedures (including critical care time) Labs Review Labs Reviewed  CBC  SALICYLATE LEVEL  COMPREHENSIVE METABOLIC PANEL  ACETAMINOPHEN LEVEL  URINE RAPID DRUG SCREEN (HOSP PERFORMED)   Imaging Review No results found.   Date: 05/25/2013  Rate: 100  Rhythm: sinus tachycardia  QRS Axis: normal  Intervals: normal  ST/T Wave abnormalities: normal  Conduction Disutrbances:none  Narrative Interpretation:   Old EKG Reviewed: none available    MDM   Final diagnoses:  None  The patient is a 28 year old male with history of drug abuse. He was brought today after an apparent overdose of opioids. He admits to crushing up and injecting Opana pill which he purchased on the Street. He was with his sister and became unresponsive and apneic. CPR was started by his sister. 911 was called and Narcan was given. He then rapidly woke  up. Workup today reveals drug screen positive for opiates, cocaine, and marijuana, but was otherwise unremarkable. He was observed in the ER for several hours and had no further somnolence or respiratory distress.  I had a lengthy discussion with him, his sister, and his mother. He was offered to speak with the crisis workers to discuss the possibility of inpatient or outpatient treatment options. He refused these. I advised him that the next time he may not be so lucky and if he continues this he could potentially die. He understands these risks.    Geoffery Lyonsouglas Bryar Rennie, MD 05/25/13 [phone]

## 2014-02-14 ENCOUNTER — Encounter (HOSPITAL_COMMUNITY): Payer: Self-pay | Admitting: *Deleted

## 2014-02-14 ENCOUNTER — Emergency Department (HOSPITAL_COMMUNITY): Payer: Self-pay

## 2014-02-14 ENCOUNTER — Emergency Department (HOSPITAL_COMMUNITY)
Admission: EM | Admit: 2014-02-14 | Discharge: 2014-02-15 | Disposition: A | Discharge status: Home / Self Care | Payer: Self-pay | Attending: Emergency Medicine | Admitting: Emergency Medicine

## 2014-02-14 DIAGNOSIS — S82302A Unspecified fracture of lower end of left tibia, initial encounter for closed fracture: Secondary | ICD-10-CM | POA: Insufficient documentation

## 2014-02-14 DIAGNOSIS — Y9389 Activity, other specified: Secondary | ICD-10-CM | POA: Insufficient documentation

## 2014-02-14 DIAGNOSIS — Y998 Other external cause status: Secondary | ICD-10-CM | POA: Insufficient documentation

## 2014-02-14 DIAGNOSIS — S42002A Fracture of unspecified part of left clavicle, initial encounter for closed fracture: Secondary | ICD-10-CM | POA: Insufficient documentation

## 2014-02-14 DIAGNOSIS — S80212A Abrasion, left knee, initial encounter: Secondary | ICD-10-CM | POA: Insufficient documentation

## 2014-02-14 DIAGNOSIS — W19XXXA Unspecified fall, initial encounter: Secondary | ICD-10-CM

## 2014-02-14 DIAGNOSIS — Y92410 Unspecified street and highway as the place of occurrence of the external cause: Secondary | ICD-10-CM | POA: Insufficient documentation

## 2014-02-14 DIAGNOSIS — S060X9A Concussion with loss of consciousness of unspecified duration, initial encounter: Secondary | ICD-10-CM | POA: Insufficient documentation

## 2014-02-14 DIAGNOSIS — S92062A Displaced intraarticular fracture of left calcaneus, initial encounter for closed fracture: Secondary | ICD-10-CM | POA: Insufficient documentation

## 2014-02-14 DIAGNOSIS — Z23 Encounter for immunization: Secondary | ICD-10-CM | POA: Insufficient documentation

## 2014-02-14 MED ORDER — TETANUS-DIPHTH-ACELL PERTUSSIS 5-2.5-18.5 LF-MCG/0.5 IM SUSP
0.5000 mL | Freq: Once | INTRAMUSCULAR | Status: DC
  Filled 2014-02-14: qty 0.5

## 2014-02-14 MED ORDER — HYDROCODONE-ACETAMINOPHEN 5-325 MG PO TABS
2.0000 | ORAL_TABLET | Freq: Once | ORAL | Status: AC
  Administered 2014-02-15: 2 via ORAL
  Filled 2014-02-14: qty 2

## 2014-02-14 NOTE — ED Notes (Signed)
Awake. Verbally responsive. Resp even and unlabored. ABC's intact. 
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
## 2014-02-15 ENCOUNTER — Emergency Department (HOSPITAL_COMMUNITY): Payer: Worker's Compensation

## 2014-02-15 MED ORDER — HYDROCODONE-ACETAMINOPHEN 5-325 MG PO TABS: 2.0000 | ORAL_TABLET | Freq: Two times a day (BID) | ORAL | Status: DC | PRN

## 2014-02-15 NOTE — ED Notes (Signed)
Resting quietly with eye closed. Easily arousable. Verbally responsive. Resp even and unlabored. ABC's intact. NAD noted.  

## 2014-02-15 NOTE — ED Notes (Signed)
Awake. Verbally responsive. Resp even and unlabored. ABC's intact. NAD noted. 

## 2014-02-15 NOTE — ED Notes (Signed)
Ortho tech at bedside 

## 2014-02-15 NOTE — ED Notes (Signed)
Applied sling immoblizer to lt arm. Pt tolerated well. (+)PMS and CRT brisk.

## 2014-02-15 NOTE — ED Notes (Addendum)
Awake Verbally responsive. Resp even and unlabored. ABC's intact. NAD noted. Pt able to move toes after application of lt foot immoblizer.. No discoloration of lt toes.
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
## 2014-02-20 ENCOUNTER — Encounter (HOSPITAL_COMMUNITY): Payer: Self-pay | Admitting: *Deleted

## 2014-02-20 ENCOUNTER — Emergency Department (HOSPITAL_COMMUNITY)
Admission: EM | Admit: 2014-02-20 | Discharge: 2014-02-20 | Disposition: A | Discharge status: Home / Self Care | Payer: Worker's Compensation | Attending: Emergency Medicine | Admitting: Emergency Medicine

## 2014-02-20 DIAGNOSIS — S92002S Unspecified fracture of left calcaneus, sequela: Secondary | ICD-10-CM | POA: Insufficient documentation

## 2014-02-20 DIAGNOSIS — S42002S Fracture of unspecified part of left clavicle, sequela: Secondary | ICD-10-CM | POA: Insufficient documentation

## 2014-02-20 MED ORDER — HYDROCODONE-ACETAMINOPHEN 5-325 MG PO TABS: 2.0000 | ORAL_TABLET | ORAL | Status: DC | PRN

## 2014-02-20 MED ORDER — HYDROCODONE-ACETAMINOPHEN 5-325 MG PO TABS
2.0000 | ORAL_TABLET | Freq: Once | ORAL | Status: AC
Start: 2014-02-20 — End: 2014-02-20
  Administered 2014-02-20: 2 via ORAL
  Filled 2014-02-20: qty 2
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
## 2014-02-20 NOTE — Discharge Instructions (Signed)
Clavicle Fracture °The clavicle, also called the collarbone, is the long bone that connects your shoulder to your rib cage. You can feel your collarbone at the top of your shoulders and rib cage. A clavicle fracture is a broken clavicle. It is a common injury that can happen at any age.  °CAUSES °Common causes of a clavicle fracture include: °· A direct blow to your shoulder. °· A car accident. °· A fall, especially if you try to break your fall with an outstretched arm. °RISK FACTORS °You may be at increased risk if: °· You are younger than 25 years or older than 75 years. Most clavicle fractures happen to people who are younger than 25 years. °· You are a male. °· You play contact sports. °SIGNS AND SYMPTOMS °A fractured clavicle is painful. It also makes it hard to move your arm. Other signs and symptoms may include: °· A shoulder that drops downward and forward. °· Pain when trying to lift your shoulder. °· Bruising, swelling, and tenderness over your clavicle. °· A grinding noise when you try to move your shoulder. °· A bump over your clavicle. °DIAGNOSIS °Your health care provider can usually diagnose a clavicle fracture by asking about your injury and examining your shoulder and clavicle. He or she may take an X-ray to determine the position of your clavicle. °TREATMENT °Treatment depends on the position of your clavicle after the fracture: °· If the broken ends of the bone are not out of place, your health care provider may put your arm in a sling or wrap a support bandage around your chest (figure-of-eight wrap). °· If the broken ends of the bone are out of place, you may need surgery. Surgery may involve placing screws, pins, or plates to keep your clavicle stable while it heals. Healing may take about 3 months. °When your health care provider thinks your fracture has healed enough, you may have to do physical therapy to regain normal movement and build up your arm strength. °HOME CARE INSTRUCTIONS   °· Apply ice to the injured area: °¨ Put ice in a plastic bag. °¨ Place a towel between your skin and the bag. °¨ Leave the ice on for 20 minutes, 2-3 times a day. °· If you have a wrap or splint: °¨ Wear it all the time, and remove it only to take a bath or shower. °¨ When you bathe or shower, keep your shoulder in the same position as when the sling or wrap is on. °¨ Do not lift your arm. °· If you have a figure-of-eight wrap: °¨ Another person must tighten it every day. °¨ It should be tight enough to hold your shoulders back. °¨ Allow enough room to place your index finger between your body and the strap. °¨ Loosen the wrap immediately if you feel numbness or tingling in your hands. °· Only take medicines as directed by your health care provider. °· Avoid activities that make the injury or pain worse for 4-6 weeks after surgery. °· Keep all follow-up appointments. °SEEK MEDICAL CARE IF:  °Your medicine is not helping to relieve pain and swelling. °SEEK IMMEDIATE MEDICAL CARE IF:  °Your arm is numb, cold, or pale, even when the splint is loose. °MAKE SURE YOU:  °· Understand these instructions. °· Will watch your condition. °· Will get help right away if you are not doing well or get worse. °Document Released: 12/19/2004 Document Revised: 03/16/2013 Document Reviewed: 02/01/2013 °ExitCare® Patient Information ©2015 [employer]. This information is   not intended to replace advice given to you by your health care provider. Make sure you discuss any questions you have with your health care provider. ° °
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
## 2014-03-03 ENCOUNTER — Emergency Department (HOSPITAL_COMMUNITY)
Admission: EM | Admit: 2014-03-03 | Discharge: 2014-03-03 | Disposition: A | Discharge status: Home / Self Care | Payer: Worker's Compensation | Attending: Emergency Medicine | Admitting: Emergency Medicine

## 2014-03-03 ENCOUNTER — Encounter (HOSPITAL_COMMUNITY): Payer: Self-pay | Admitting: Emergency Medicine

## 2014-03-03 DIAGNOSIS — F1910 Other psychoactive substance abuse, uncomplicated: Secondary | ICD-10-CM | POA: Insufficient documentation

## 2014-03-03 HISTORY — DX: Other psychoactive substance abuse, uncomplicated: F19.10

## 2014-03-03 LAB — COMPREHENSIVE METABOLIC PANEL
ALT: 26 U/L (ref 0–53)
AST: 25 U/L (ref 0–37)
Albumin: 3.8 g/dL (ref 3.5–5.2)
Alkaline Phosphatase: 106 U/L (ref 39–117)
Anion gap: 13 (ref 5–15)
BILIRUBIN TOTAL: 1.9 mg/dL — AB (ref 0.3–1.2)
BUN: 13 mg/dL (ref 6–23)
CO2: 26 meq/L (ref 19–32)
Calcium: 9.9 mg/dL (ref 8.4–10.5)
Chloride: 93 mEq/L — ABNORMAL LOW (ref 96–112)
Creatinine, Ser: 0.86 mg/dL (ref 0.50–1.35)
GFR calc Af Amer: >90 mL/min (ref >90)
Glucose, Bld: 98 mg/dL (ref 70–99)
Potassium: 4 mEq/L (ref 3.7–5.3)
SODIUM: 132 meq/L — AB (ref 137–147)
Total Protein: 7.8 g/dL (ref 6.0–8.3)

## 2014-03-03 LAB — CBC WITH DIFFERENTIAL/PLATELET
Basophils Absolute: 0 10*3/uL (ref 0.0–0.1)
Basophils Relative: 1 % (ref 0–1)
Eosinophils Absolute: 0 10*3/uL (ref 0.0–0.7)
Eosinophils Relative: 0 % (ref 0–5)
HCT: 39.5 % (ref 39.0–52.0)
HEMOGLOBIN: 13.1 g/dL (ref 13.0–17.0)
LYMPHS PCT: 23 % (ref 12–46)
Lymphs Abs: 1.5 10*3/uL (ref 0.7–4.0)
MCH: 27.9 pg (ref 26.0–34.0)
MCHC: 33.2 g/dL (ref 30.0–36.0)
MCV: 84 fL (ref 78.0–100.0)
Monocytes Absolute: 0.5 10*3/uL (ref 0.1–1.0)
Monocytes Relative: 9 % (ref 3–12)
Neutro Abs: 4.2 10*3/uL (ref 1.7–7.7)
Neutrophils Relative %: 67 % (ref 43–77)
PLATELETS: 167 10*3/uL (ref 150–400)
RBC: 4.7 MIL/uL (ref 4.22–5.81)
RDW: 13.7 % (ref 11.5–15.5)
WBC: 6.3 10*3/uL (ref 4.0–10.5)

## 2014-03-03 LAB — ETHANOL: Alcohol, Ethyl (B): <11 mg/dL (ref 0–11)

## 2014-03-03 MED ORDER — LORAZEPAM 1 MG PO TABS: 1.0000 mg | ORAL_TABLET | Freq: Three times a day (TID) | ORAL | Status: DC | PRN

## 2014-03-03 NOTE — ED Notes (Signed)
Security notified to wand pt. at triage , pt. wearing blue paper scrubs , pt.'s clothes bagged/labelled and stored at triage nurse's station .

## 2014-03-03 NOTE — ED Provider Notes (Signed)
CSN: [id number]     Arrival date & time 03/03/14  1934 History   First MD Initiated Contact with Patient 03/03/14 2045     Chief Complaint  Patient presents with  . Drug Problem     (Consider location/radiation/quality/duration/timing/severity/associated sxs/prior Treatment) HPI  Pt is a 28yo male presenting to ED with father, requesting detox from pain medications.Per father, pt is near homelessness as pt is currently living with his father but father states he is about to kick him out due to his drug abuse. Pt has been abusing oxycodone, heroine and "other" pain medications for "years"   Most of the hx is provided by father as pt does not elaborate while he is in ED tonight.  Pt does deny SI or HI. Denise use of ETOH. No hx of seizures.  Denies chest pain, SOB, abdominal pain, n/v/d.    Past Medical History  Diagnosis Date  . Polysubstance abuse    Past Surgical History  Procedure Laterality Date  . Kidney surgery    . Hand surgery      L hand   No family history on file. History  Substance Use Topics  . Smoking status: Never Smoker   . Smokeless tobacco: Not on file  . Alcohol Use: Yes     Comment: occasionally    Review of Systems  Constitutional: Negative for fever and chills.  Respiratory: Negative for cough and shortness of breath.   Cardiovascular: Negative for chest pain and palpitations.  Gastrointestinal: Negative for nausea, vomiting and abdominal pain.  Psychiatric/Behavioral: Negative for suicidal ideas and self-injury. The patient is not nervous/anxious.   All other systems reviewed and are negative.     Allergies  Review of patient's allergies indicates no known allergies.  Home Medications   Prior to Admission medications   Medication Sig Start Date End Date Taking? Authorizing Provider  ibuprofen (ADVIL,MOTRIN) 200 MG tablet Take 400 mg by mouth every 6 (six) hours as needed for moderate pain (pain).   Yes Historical Provider, MD   HYDROcodone-acetaminophen (NORCO/VICODIN) 5-325 MG per tablet Take 2 tablets by mouth every 4 (four) hours as needed. 02/20/14   Elson AreasLeslie K Sofia, PA-C  LORazepam (ATIVAN) 1 MG tablet Take 1 tablet (1 mg total) by mouth 3 (three) times daily as needed for anxiety. 03/03/14   Junius FinnerErin O'Malley, PA-C  oxyCODONE (OXY IR/ROXICODONE) 5 MG immediate release tablet Take 5 tablets by mouth every 4 (four) hours as needed for moderate pain.  02/24/14   Historical Provider, MD   BP 112/68 mmHg  Pulse 62  Temp(Src) 98.4 F (36.9 C) (Oral)  Resp 16  Ht 5\' 11"  (1.803 m)  Wt 185 lb (83.915 kg)  BMI 25.81 kg/m2  SpO2 99% Physical Exam  Constitutional: He appears well-developed and well-nourished.  HENT:  Head: Normocephalic and atraumatic.  Eyes: Conjunctivae are normal. No scleral icterus.  Neck: Normal range of motion.  Cardiovascular: Normal rate, regular rhythm and normal heart sounds.   Pulmonary/Chest: Effort normal and breath sounds normal. No respiratory distress. He has no wheezes. He has no rales. He exhibits no tenderness.  Abdominal: Soft. Bowel sounds are normal. He exhibits no distension and no mass. There is no tenderness. There is no rebound and no guarding.  Musculoskeletal: Normal range of motion.  Neurological: He is alert.  Skin: Skin is warm and dry.  Psychiatric: He has a normal mood and affect. His speech is normal. Thought content normal.  Nursing note and vitals reviewed.   ED  Course  Procedures (including critical care time) Labs Review Labs Reviewed  COMPREHENSIVE METABOLIC PANEL - Abnormal; Notable for the following:    Sodium 132 (*)    Chloride 93 (*)    Total Bilirubin 1.9 (*)    All other components within normal limits  ETHANOL  CBC WITH DIFFERENTIAL  URINE RAPID DRUG SCREEN (HOSP PERFORMED)    Imaging Review No results found.   EKG Interpretation None      MDM   Final diagnoses:  Polysubstance abuse   Pt is a 28yo male requesting detox from pain  medications.  Denies SI or HI. Denies use of ETOH. No other concerns or complaints. Father is concerned though, that pt will be homeless soon as he is about to kick him out due to the drug abuse.  Pt is medically cleared to be discharged home to seek treatment for drug abuse.  Rx: ativan to help with withdrawal symptoms. Strongly encouraged pt to call treatment centers starting tomorrow morning and to be persistent. Return precautions provided. Pt verbalized understanding and agreement with tx plan.   Junius Finnerrin O'Malley, PA-C 03/03/14 2151  Gwyneth SproutWhitney Plunkett, MD 03/03/14 [phone]

## 2014-03-03 NOTE — ED Notes (Addendum)
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

## 2014-03-03 NOTE — Discharge Instructions (Signed)
°Emergency Department Resource Guide °1) Find a Doctor and Pay Out of Pocket °Although you won't have to find out who is covered by your insurance plan, it is a good idea to ask around and get recommendations. You will then need to call the office and see if the doctor you have chosen will accept you as a new patient and what types of options they offer for patients who are self-pay. Some doctors offer discounts or will set up payment plans for their patients who do not have insurance, but you will need to ask so you aren't surprised when you get to your appointment. ° °2) Contact Your Local Health Department °Not all health departments have doctors that can see patients for sick visits, but many do, so it is worth a call to see if yours does. If you don't know where your local health department is, you can check in your phone book. The CDC also has a tool to help you locate your state's health department, and many state websites also have listings of all of their local health departments. ° °3) Find a Walk-in Clinic °If your illness is not likely to be very severe or complicated, you may want to try a walk in clinic. These are popping up all over the country in pharmacies, drugstores, and shopping centers. They're usually staffed by nurse practitioners or physician assistants that have been trained to treat common illnesses and complaints. They're usually fairly quick and inexpensive. However, if you have serious medical issues or chronic medical problems, these are probably not your best option. ° °No Primary Care Doctor: °- Call [facility] Connect at  832-8000 - they can help you locate a primary care doctor that  accepts your insurance, provides certain services, etc. °- Physician Referral Service- 1-800-533-3463 ° °Chronic Pain Problems: °Organization         Address  Phone   Notes  °[facility]  (336) 297-2271 Patients need to be referred by their primary care doctor.  ° °Medication  Assistance: °Organization         Address  Phone   Notes  °[place] [facility] [address] °[place], [place] [address] (336) 641-8030 --Must be a resident of [place] °-- Must have NO insurance coverage whatsoever (no Medicaid/ Medicare, etc.) °-- The pt. MUST have a primary care doctor that directs their care regularly and follows them in the community °  °[facility]  (866) 331-1348   °[employer]  (888) 892-1162   ° °Agencies that provide inexpensive medical care: °Organization         Address  Phone   Notes  °[facility]  (336) 832-8035   °[facility]    (336) 832-7272   °[facility] [address] °[place], [place] [address] (336) 832-4777   °[facility] [address], °[place] (336) 271-4999   °[facility]    (336) 373-0678   °[facility]    (336) 272-1050   °[facility] ° [address], [place] Phone:  (336) 832-4444, Fax:  (336) 832-4440 Hours of Operation:  9 am - 6 pm, M-F.  Also accepts Medicaid/Medicare and self-pay.  °[facility] ° [address], [place] Phone: (336) 832-3150, Fax: (336) 832-3151. Hours of Operation:  8:30 am - 5:30 pm, M-F.  Also accepts Medicaid and self-pay.  °[facility] [place] [address], [place] Phone: (336) 878-6027   °[facility] [address], [place], [place] (336)723-1848, Ext. 123 Mondays & Thursdays: 7-9 AM.  First 15 patients are seen on a first come, first serve basis. °  ° °Medicaid-accepting [place] Providers: ° °Organization         Address  Phone   Notes  °[facility] [address], [place] (336) 641-2100 Also accepts self-pay patients.  °[facility] [address], [place] ° (336) 856-9996   °[facility] [address], [place]  (336) 288-8857   °[facility] [address] [place] [address], [place] (336) 299-7000   °Veita Bland [address], [place]  ° (336) 373-1557 Only accepts [place] Access Medicaid patients after they have their name applied to their card.  ° °Self-Pay (no insurance) in [place]: ° °Organization         Address  Phone   Notes  °Sickle Cell Patients, [facility] [address], [place] (336) 832-1970   °[facility] [address], [place] (336) 832-4400   °[facility] ° [address], [place] (336) 992-4800   °[facility]/Dr. Osei-Bonsu ° [address] [place] [address], [place] or [address], [place] (336) 841-8500 Phone number for both [place] and [place] locations is the same.  °[facility] [address], [place] (336) 299-0000   °[facility] [address] [place] [address], [place] or [address] (336) 852-7530 °(336) 878-2260   °[facility] [address], [place] (336) 350-1642, phone; (336) 294-5005, fax Sees patients 1st and 3rd Saturday of every month.  Must not qualify for public or private insurance (i.e. Medicaid, Medicare, [place] Health Choice, Veterans' Benefits) • Household income should be no more than 200% of the poverty level •The clinic cannot treat you if you are pregnant or think you are pregnant • Sexually transmitted diseases are not treated at the clinic.  ° ° °Dental Care: °Organization         Address  Phone  Notes  °[place] [facility] [address], [place] (336) 641-6152 Accepts children up to age 21 who are enrolled in Medicaid or [place] Health Choice; pregnant women with a Medicaid card; and children who have applied for Medicaid or [place] Health Choice, but were declined, whose parents can pay a reduced fee at time of service.  °[place]  [facility] [place]  [address], [place] (336) 641-7733 Accepts children up to age 21 who are enrolled in Medicaid or [place] Health Choice; pregnant women with a Medicaid card; and children who have applied for Medicaid or [place] Health Choice, but were declined, whose parents can pay a reduced fee at time of service.  °[facility] ° [address], [place] (336) 641-4533 Patients are seen by appointment only. Walk-ins are not accepted. [facility] will see patients 18 years of age and older. °Monday - Tuesday (8am-5pm) °Most Wednesdays (8:30-5pm) °$30 per visit, cash only  °[facility] ° [address], [place] (336) 641-4533 Patients are seen by appointment only. Walk-ins are not accepted. [facility] will see patients 18 years of age and older. °One   Wednesday Evening (Monthly: Volunteer Based).  $30 per visit, cash only  °[facility]  (919) 537-3737 for adults; Children under age 4, call Graduate Pediatric Dentistry at (919) [phone]. Children aged 4-14, please call (919) [phone] to request a pediatric application. ° Dental services are provided in all areas of dental care including fillings, crowns and bridges, complete and partial dentures, implants, gum treatment, root canals, and extractions. Preventive care is also provided. Treatment is provided to both adults and children. °Patients are selected via a lottery and there is often a waiting list. °  °[facility] [address], °[place] ° (336) 763-8833 [email or web address] °  °[facility] [address], [place], [place] (336)723-1848, Ext. 123 Second and Fourth Thursday of each month, opens at 6:30 AM; Clinic ends at 9 AM.  Patients are seen on a first-come first-served basis, and a limited number are seen during each clinic.  ° °[facility] ° [address], [place], [place] (336) 723-7904    Eligibility Requirements °You must have lived in [place], [place], or [place] counties for at least the last three months. °  You cannot be eligible for state or federal sponsored healthcare insurance, including Veterans Administration, Medicaid, or Medicare. °  You generally cannot be eligible for healthcare insurance through your employer.  °  How to apply: °Eligibility screenings are held every Tuesday and Wednesday afternoon from 1:00 pm until 4:00 pm. You do not need an appointment for the interview!  °[facility] [address], [place], [place] 336-631-2330   °[facility] [facility]  336-342-8273   °[facility]  336-703-3100   °[facility]  336-570-6415   ° °Behavioral Health Resources in the Community: °Intensive Outpatient Programs °Organization         Address  Phone  Notes  °[facility] [address], [place], [place] 336-878-6098   °[facility] [address], [place], [place] 336-832-9800   °[facility] [address], [place], [place] ° 336-882-2125   °[facility] [address],  °[place], [place] 1-800-853-5163 or 336-641-4981   °Substance Abuse Resources °Organization         Address  Phone  Notes  °[facility]  336-882-2125   °[facility]  336-784-9470   °[facility]  336-285-9073   °[facility]  336-845-3988   °Residential & Outpatient Substance Abuse Program  1-800-659-3381   °Psychological Services °Organization         Address  Phone  Notes  °[facility]  336- 832-9600   °[facility]  336- 378-7881   °[facility] [address], [place] 1-800-853-5163 or 336-641-4981   ° °Mobile Crisis Teams °Organization         Address  Phone  Notes  °[facility], Mobile Crisis Care Unit  1-877-626-1772   °Assertive °[facility] ° [address]  [place], [place] 336-834-9664   °Sharon DeEsch [address] °[place] 336-554-5454   ° °Self-Help/Support Groups °Organization         Address  Phone             Notes  °[facility]  - variety of support groups  336- 373-1402 Call for more information  °Narcotics Anonymous (NA), [facility] [address], °[place]  2 meetings at this location  ° °  Residential Treatment Programs °Organization         Address  Phone  Notes  °[facility] [address],    °[place]  1-866-801-8205   °[facility] ° [address], [place], [place] 704-293-8524   °[facility] [address], [place] 336-845-3988 Admissions: 8am-3pm M-F  °[facility] [address]    °[place], [place] 336-841-1104   °[facility] [address], [place], [place] 336-379-7146   °[facility] [address]  °[place], [place] 336-285-9073   °[facility] Intensive Outpatient [address], [place], [place] 336-852-3033   °[facility] [address]  °[place], [place] 1-877-615-2722 or 336-784-9470   °[facility] [address] [place], [place] 336-227-7417 Accepts Medicaid  °[facility] [address]  °[place] 1-800-659-3381 Substance Abuse/Addiction Treatment  ° °[place] Behavioral Health Resources °Organization         Address  Phone  Notes  °[facility]  (888) 581-9988   °Julie Brannon, PhD [address] [place], [place]   (336) 349-5553 or (336) 951-0000   °[facility]   [address] °[place], [place] (336) 349-4454   °[facility] [address], [place], [place] (336) 342-8316 Insurance/Medicaid/sponsorship through [facility]  °[facility] [address]                                    [place], [place] (336) 342-8316 Therapy/tele-psych/case    °[facility] [address]  ° [place], [place] (336) 349-2233    °Dr. Arfeen  (336) 349-4544   °[facility] [place]  [employer] [place] [facility] 1) [address],  °2) [address], [place] °3)  [address], [place] (336) 349-3220 °(336) 342-7768 ° °(336) 342-8140   °[place] [facility] (336) 342-1394 or (336) 342-3537 (After Hours)    ° ° °

## 2014-03-03 NOTE — ED Notes (Signed)
O'Malley, PA at bedside.  

## 2014-03-03 NOTE — ED Notes (Signed)
Security wanded pt. at triage. 

## 2014-08-27 ENCOUNTER — Encounter (HOSPITAL_COMMUNITY): Payer: Self-pay

## 2014-08-27 ENCOUNTER — Emergency Department (HOSPITAL_COMMUNITY)
Admission: EM | Admit: 2014-08-27 | Discharge: 2014-08-27 | Disposition: A | Discharge status: Home / Self Care | Payer: Self-pay | Attending: Emergency Medicine | Admitting: Emergency Medicine

## 2014-08-27 ENCOUNTER — Emergency Department (HOSPITAL_COMMUNITY): Payer: Worker's Compensation

## 2014-08-27 DIAGNOSIS — Y9289 Other specified places as the place of occurrence of the external cause: Secondary | ICD-10-CM | POA: Insufficient documentation

## 2014-08-27 DIAGNOSIS — Y9389 Activity, other specified: Secondary | ICD-10-CM | POA: Insufficient documentation

## 2014-08-27 DIAGNOSIS — M25512 Pain in left shoulder: Secondary | ICD-10-CM

## 2014-08-27 DIAGNOSIS — Z23 Encounter for immunization: Secondary | ICD-10-CM | POA: Insufficient documentation

## 2014-08-27 DIAGNOSIS — Z8659 Personal history of other mental and behavioral disorders: Secondary | ICD-10-CM | POA: Insufficient documentation

## 2014-08-27 DIAGNOSIS — W01198A Fall on same level from slipping, tripping and stumbling with subsequent striking against other object, initial encounter: Secondary | ICD-10-CM | POA: Insufficient documentation

## 2014-08-27 DIAGNOSIS — S21112A Laceration without foreign body of left front wall of thorax without penetration into thoracic cavity, initial encounter: Secondary | ICD-10-CM | POA: Insufficient documentation

## 2014-08-27 DIAGNOSIS — Y998 Other external cause status: Secondary | ICD-10-CM | POA: Insufficient documentation

## 2014-08-27 DIAGNOSIS — S21119A Laceration without foreign body of unspecified front wall of thorax without penetration into thoracic cavity, initial encounter: Secondary | ICD-10-CM

## 2014-08-27 DIAGNOSIS — S4992XA Unspecified injury of left shoulder and upper arm, initial encounter: Secondary | ICD-10-CM | POA: Insufficient documentation

## 2014-08-27 MED ORDER — NAPROXEN 500 MG PO TABS: 500.0000 mg | ORAL_TABLET | Freq: Two times a day (BID) | ORAL | Status: DC

## 2014-08-27 MED ORDER — LIDOCAINE-EPINEPHRINE (PF) 2 %-1:200000 IJ SOLN
20.0000 mL | Freq: Once | INTRAMUSCULAR | Status: AC
  Administered 2014-08-27: 20 mL via INTRADERMAL
  Filled 2014-08-27: qty 20

## 2014-08-27 MED ORDER — TETANUS-DIPHTH-ACELL PERTUSSIS 5-2.5-18.5 LF-MCG/0.5 IM SUSP
0.5000 mL | Freq: Once | INTRAMUSCULAR | Status: AC
  Administered 2014-08-27: 0.5 mL via INTRAMUSCULAR
  Filled 2014-08-27: qty 0.5

## 2014-08-27 MED ORDER — HYDROCODONE-ACETAMINOPHEN 5-325 MG PO TABS
1.0000 | ORAL_TABLET | Freq: Once | ORAL | Status: AC
Start: 2014-08-27 — End: 2014-08-27
  Administered 2014-08-27: 1 via ORAL
  Filled 2014-08-27: qty 1

## 2014-08-27 MED ORDER — TRAMADOL HCL 50 MG PO TABS: 50.0000 mg | ORAL_TABLET | Freq: Four times a day (QID) | ORAL | Status: DC | PRN

## 2014-08-27 MED ORDER — LIDOCAINE-EPINEPHRINE (PF) 2 %-1:200000 IJ SOLN: 10.0000 mL | Freq: Once | INTRAMUSCULAR | Status: DC

## 2014-08-27 NOTE — ED Provider Notes (Signed)
LACERATION REPAIR Performed ZO:XWRUEAVWby:Danielle Montez Moritaarter, PA-S, supervised by  Lottie MusselKIRICHENKO, Myeasha Ballowe A Authorized by: Jaynie CrumbleKIRICHENKO, Ike Maragh A Consent: Verbal consent obtained. Risks and benefits: risks, benefits and alternatives were discussed Consent given by: patient Patient identity confirmed: provided demographic data Prepped and Draped in normal sterile fashion Wound explored  Laceration Location: left chest wall  Laceration Length: 2cm  No Foreign Bodies seen or palpated  Anesthesia: local infiltration  Local anesthetic: lidocaine 2% w epinephrine  Anesthetic total: 2 ml  Irrigation method: syringe Amount of cleaning: standard  Skin closure: prolene 4.0  Number of sutures: 2  Technique: simple interrupted  Patient tolerance: Patient tolerated the procedure well with no immediate complications.   Jaynie Crumbleatyana Grecia Lynk, PA-C 08/27/14 1107  Elwin MochaBlair [place], MD 08/27/14 [phone]
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
## 2014-08-27 NOTE — ED Notes (Signed)
Pt returned from xray

## 2014-08-27 NOTE — ED Notes (Signed)
Patient transported to X-ray 
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
## 2014-08-27 NOTE — ED Notes (Signed)
Declined W/C at D/C and was escorted to lobby by RN. 

## 2014-08-27 NOTE — Discharge Instructions (Signed)
Please read and follow all provided instructions.  Your diagnoses today include:  1. Clavicle pain, left   2. Laceration of chest wall, initial encounter     Tests performed today include:  X-ray of the affected area that did not show any foreign bodies or new broken bones  Vital signs. See below for your results today.   Medications prescribed:   Tramadol - narcotic-like pain medication  DO NOT drive or perform any activities that require you to be awake and alert because this medicine can make you drowsy.    Naproxen - anti-inflammatory pain medication  Do not exceed 500mg  naproxen every 12 hours, take with food  You have been prescribed an anti-inflammatory medication or NSAID. Take with food. Take smallest effective dose for the shortest duration needed for your pain. Stop taking if you experience stomach pain or vomiting.   Take any prescribed medications only as directed.   Home care instructions:  Follow any educational materials and wound care instructions contained in this packet.   Keep affected area above the level of your heart when possible to minimize swelling. Wash area gently twice a day with warm soapy water. Do not apply alcohol or hydrogen peroxide. Cover the area if it draining or weeping.   Follow-up instructions: Suture Removal: Return to the Emergency Department or see your primary care care doctor in 7 days for a recheck of your wound and removal of your sutures or staples.    Return instructions:  Return to the Emergency Department if you have:  Fever  Worsening pain  Worsening swelling of the wound  Pus draining from the wound  Redness of the skin that moves away from the wound, especially if it streaks away from the affected area   Any other emergent concerns  Your vital signs today were: BP 153/84 mmHg   Pulse 110   Temp(Src) 98.1 F (36.7 C) (Oral)   Resp 16   Ht 5\' 10"  (1.778 m)   Wt 185 lb (83.915 kg)   BMI 26.54 kg/m2   SpO2  100% If your blood pressure (BP) was elevated above 135/85 this visit, please have this repeated by your doctor within one month. --------------

## 2014-08-27 NOTE — ED Notes (Signed)
Triage assessment was under wrong name. Marylouise StacksScarlett smith, RN documented triage assessment

## 2015-03-04 ENCOUNTER — Encounter (HOSPITAL_COMMUNITY): Payer: Self-pay | Admitting: *Deleted

## 2015-03-04 ENCOUNTER — Emergency Department (HOSPITAL_COMMUNITY)
Admission: EM | Admit: 2015-03-04 | Discharge: 2015-03-04 | Disposition: A | Discharge status: Home / Self Care | Payer: No Typology Code available for payment source | Attending: Emergency Medicine | Admitting: Emergency Medicine

## 2015-03-04 ENCOUNTER — Emergency Department (HOSPITAL_COMMUNITY): Payer: No Typology Code available for payment source

## 2015-03-04 DIAGNOSIS — S39012A Strain of muscle, fascia and tendon of lower back, initial encounter: Secondary | ICD-10-CM | POA: Insufficient documentation

## 2015-03-04 DIAGNOSIS — Y998 Other external cause status: Secondary | ICD-10-CM | POA: Diagnosis not present

## 2015-03-04 DIAGNOSIS — Y9389 Activity, other specified: Secondary | ICD-10-CM | POA: Insufficient documentation

## 2015-03-04 DIAGNOSIS — F17210 Nicotine dependence, cigarettes, uncomplicated: Secondary | ICD-10-CM | POA: Insufficient documentation

## 2015-03-04 DIAGNOSIS — Y92410 Unspecified street and highway as the place of occurrence of the external cause: Secondary | ICD-10-CM | POA: Insufficient documentation

## 2015-03-04 DIAGNOSIS — S20212A Contusion of left front wall of thorax, initial encounter: Secondary | ICD-10-CM | POA: Diagnosis not present

## 2015-03-04 DIAGNOSIS — Z791 Long term (current) use of non-steroidal anti-inflammatories (NSAID): Secondary | ICD-10-CM | POA: Diagnosis not present

## 2015-03-04 DIAGNOSIS — S3992XA Unspecified injury of lower back, initial encounter: Secondary | ICD-10-CM | POA: Diagnosis present

## 2015-03-04 MED ORDER — IBUPROFEN 400 MG PO TABS
400.0000 mg | ORAL_TABLET | Freq: Once | ORAL | Status: AC
  Administered 2015-03-04: 400 mg via ORAL
  Filled 2015-03-04: qty 1

## 2015-03-04 MED ORDER — CYCLOBENZAPRINE HCL 10 MG PO TABS: 10.0000 mg | ORAL_TABLET | Freq: Two times a day (BID) | ORAL | Status: AC | PRN
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
## 2015-03-04 NOTE — ED Notes (Signed)
Declined W/C at D/C and was escorted to lobby by RN. 

## 2015-03-04 NOTE — ED Notes (Signed)
SEE PA assessment 
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
## 2015-11-14 ENCOUNTER — Encounter (HOSPITAL_COMMUNITY): Payer: Self-pay

## 2015-11-14 ENCOUNTER — Emergency Department (HOSPITAL_COMMUNITY)
Admission: EM | Admit: 2015-11-14 | Discharge: 2015-11-14 | Disposition: A | Discharge status: Home / Self Care | Payer: Worker's Compensation | Attending: Emergency Medicine | Admitting: Emergency Medicine

## 2015-11-14 ENCOUNTER — Emergency Department (HOSPITAL_COMMUNITY): Payer: Worker's Compensation

## 2015-11-14 DIAGNOSIS — F1290 Cannabis use, unspecified, uncomplicated: Secondary | ICD-10-CM | POA: Insufficient documentation

## 2015-11-14 DIAGNOSIS — R079 Chest pain, unspecified: Secondary | ICD-10-CM | POA: Insufficient documentation

## 2015-11-14 DIAGNOSIS — Z7982 Long term (current) use of aspirin: Secondary | ICD-10-CM | POA: Insufficient documentation

## 2015-11-14 DIAGNOSIS — L02412 Cutaneous abscess of left axilla: Secondary | ICD-10-CM | POA: Insufficient documentation

## 2015-11-14 DIAGNOSIS — Z791 Long term (current) use of non-steroidal anti-inflammatories (NSAID): Secondary | ICD-10-CM | POA: Insufficient documentation

## 2015-11-14 DIAGNOSIS — F17210 Nicotine dependence, cigarettes, uncomplicated: Secondary | ICD-10-CM | POA: Insufficient documentation

## 2015-11-14 DIAGNOSIS — L02419 Cutaneous abscess of limb, unspecified: Secondary | ICD-10-CM

## 2015-11-14 LAB — CBC
HCT: 45.2 % (ref 39.0–52.0)
Hemoglobin: 14.7 g/dL (ref 13.0–17.0)
MCH: 29.3 pg (ref 26.0–34.0)
MCHC: 32.5 g/dL (ref 30.0–36.0)
MCV: 90.2 fL (ref 78.0–100.0)
PLATELETS: 264 10*3/uL (ref 150–400)
RBC: 5.01 MIL/uL (ref 4.22–5.81)
RDW: 14.9 % (ref 11.5–15.5)
WBC: 10.1 10*3/uL (ref 4.0–10.5)

## 2015-11-14 LAB — BASIC METABOLIC PANEL
Anion gap: 6 (ref 5–15)
BUN: 20 mg/dL (ref 6–20)
CALCIUM: 9.5 mg/dL (ref 8.9–10.3)
CO2: 29 mmol/L (ref 22–32)
CREATININE: 0.71 mg/dL (ref 0.61–1.24)
Chloride: 104 mmol/L (ref 101–111)
GFR calc Af Amer: >60 mL/min (ref >60)
Glucose, Bld: 98 mg/dL (ref 65–99)
Potassium: 4.4 mmol/L (ref 3.5–5.1)
SODIUM: 139 mmol/L (ref 135–145)

## 2015-11-14 LAB — TROPONIN I: Troponin I: <0.03 ng/mL (ref <0.03)

## 2015-11-14 MED ORDER — CEPHALEXIN 500 MG PO CAPS: 500.0000 mg | ORAL_CAPSULE | Freq: Four times a day (QID) | ORAL | 0 refills | Status: DC

## 2015-11-14 MED ORDER — LIDOCAINE-EPINEPHRINE (PF) 2 %-1:200000 IJ SOLN
10.0000 mL | Freq: Once | INTRAMUSCULAR | Status: DC
  Filled 2015-11-14: qty 10

## 2015-11-14 MED ORDER — SULFAMETHOXAZOLE-TRIMETHOPRIM 800-160 MG PO TABS: 2.0000 | ORAL_TABLET | Freq: Two times a day (BID) | ORAL | 0 refills | Status: AC

## 2015-11-14 MED ORDER — TRAMADOL HCL 50 MG PO TABS
50.0000 mg | ORAL_TABLET | Freq: Once | ORAL | Status: AC
  Administered 2015-11-14: 50 mg via ORAL
  Filled 2015-11-14: qty 1

## 2015-11-14 NOTE — ED Triage Notes (Signed)
Pt c/o increasing abscess on L axilla x 3-4 days and dull L chest pain x 2 days.  Pain score 7/10.  Pt reports cyst in L axilla x "years."  Pt reports pain shoot from axilla into chest and unrelated dull chest pain.  Sts dizziness w/ standing quickly.
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
## 2015-11-14 NOTE — ED Notes (Signed)
RN and 2 phlebotomists have attempted to obtain labs with 5 sticks and were unable to do so.  Main lab phlebotomy called.

## 2015-11-14 NOTE — ED Provider Notes (Signed)
[facility]-EMER[facility]ENC[facility] DEPT Provider Note   CSN: [id number] Arrival date & time: 11/14/15  0857     History   Chief Complaint Chief Complaint  Patient presents with  . Abscess  . Chest Pain    HPI [name] is a 30 y.o. male.  Patient presents today with two separate complaints.  He is complaining of both an abscess of his left axilla and also chest pain.  He states that the abscess of the left axilla has been present for months, but became larger and more painful over the past week.  He denies any drainage of the area.  No intervention prior to arrival.  He states that he has had abscesses in the past, but not in this particular area.  No fever, chills, nausea, vomiting, numbness or tingling.  No history of DM or HIV.  Patient also complaining of left sided non radiating chest pain.  Pain has been constant over the past couple of days.  Pain worse with palpation and with certain movements. He has a history of Cocaine use, but denies any use in the past 2 weeks.  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  No fever, chills, SOB, nausea, vomiting, LE edema, cough, hemoptysis, or any other symptoms at this time.  No prior cardiac history including endocarditis.  No history of PE or DVT.   No prolonged travel or surgeries in the past 4 weeks.        Past Medical History:  Diagnosis Date  . Polysubstance abuse     There are no active problems to display for this patient.   Past Surgical History:  Procedure Laterality Date  . HAND SURGERY     L hand  . KIDNEY SURGERY         Home Medications    Prior to Admission medications   Medication Sig Start Date End Date Taking? Authorizing Provider  acetaminophen (TYLENOL) 500 MG tablet Take 1,000 mg by mouth every 6 (six) hours as needed for moderate pain.   Yes Historical Provider, MD  albuterol (PROVENTIL HFA;VENTOLIN HFA) 108 (90 Base) MCG/ACT inhaler Inhale 2 puffs into the lungs every 6 (six) hours as  needed for wheezing or shortness of breath.   Yes Historical Provider, MD  amoxicillin (AMOXIL) 500 MG tablet Take 500 mg by mouth daily.   Yes Historical Provider, MD  aspirin 325 MG tablet Take 650 mg by mouth every 6 (six) hours as needed for moderate pain.   Yes Historical Provider, MD  cyclobenzaprine (FLEXERIL) 10 MG tablet Take 1 tablet (10 mg total) by mouth 2 (two) times daily as needed for muscle spasms. 03/04/15  Yes Nicole Pisciotta, PA-C  ibuprofen (ADVIL,MOTRIN) 200 MG tablet Take 400 mg by mouth every 6 (six) hours as needed for moderate pain (pain).   Yes Historical Provider, MD    Family History History reviewed. No pertinent family history.  Social History Social History  Substance Use Topics  . Smoking status: Current Some Day Smoker    Types: Cigarettes  . Smokeless tobacco: Never Used  . Alcohol use Yes     Comment: occasionally     Allergies   Review of patient's allergies indicates no known allergies.   Review of Systems Review of Systems  All other systems reviewed and are negative.    Physical Exam Updated Vital Signs BP 124/90 (BP Location: Right Arm)   Pulse 82   Temp 98.5 F (36.9 C) (Oral)  Resp 18   SpO2 100%   Physical Exam  Constitutional: He appears well-developed and well-nourished.  HENT:  Head: Normocephalic and atraumatic.  Neck: Normal range of motion. Neck supple.  Cardiovascular: Normal rate, regular rhythm and normal heart sounds.   No murmur heard. Pulmonary/Chest: Effort normal and breath sounds normal. No respiratory distress. He has no wheezes. He has no rales.  Musculoskeletal: Normal range of motion.  Neurological: He is alert.  Skin: Skin is warm and dry.  No Janeway lesions, roth spots, or osler nodes Large 3-4 cm abscess of the left axilla with mild surrounding erythema.  No surrounding edema.    Psychiatric: He has a normal mood and affect.  Nursing note and vitals reviewed.    ED Treatments / Results   Labs (all labs ordered are listed, but only abnormal results are displayed) Labs Reviewed  CBC  BASIC METABOLIC PANEL  TROPONIN I  I-STAT TROPOININ, ED    EKG  EKG Interpretation  Date/Time:  Tuesday November 14 2015 09:12:31 EDT Ventricular Rate:  85 PR Interval:    QRS Duration: 91 QT Interval:  360 QTC Calculation: 428 R Axis:   77 Text Interpretation:  Sinus rhythm Consider left atrial enlargement ST elev, probable normal early repol pattern No significant change since last tracing Confirmed by Rubin Payor  MD, NATHAN [phone]) on 11/14/2015 12:35:36 PM       Radiology Dg Chest 2 View  Result Date: 11/14/2015 CLINICAL DATA:  Chest pain EXAM: CHEST  2 VIEW COMPARISON:  03/04/2015 FINDINGS: Heart size and vascularity normal. Negative for heart failure. Negative for pneumonia or effusion. Lungs are clear and well aerated. Chronic left clavicle fracture. IMPRESSION: No active cardiopulmonary disease. Electronically Signed   By: Marlan Palau M.D.   On: 11/14/2015 09:32    Procedures Procedures (including critical care time)  Medications Ordered in ED Medications  lidocaine-EPINEPHrine (XYLOCAINE W/EPI) 2 %-1:200000 (PF) injection 10 mL (not administered)     Initial Impression / Assessment and Plan / ED Course  I have reviewed the triage vital signs and the nursing notes.  Pertinent labs & imaging results that were available during my care of the patient were reviewed by me and considered in my medical decision making (see chart for details).  Clinical Course    INCISION AND DRAINAGE Performed by: Santiago Glad Consent: Verbal consent obtained. Risks and benefits: risks, benefits and alternatives were discussed Type: abscess  Body area: left axilla  Anesthesia: local infiltration  Incision was made with a scalpel.  Local anesthetic: lidocaine 2% with epinephrine  Anesthetic total: 7 ml  Complexity: complex Blunt dissection to break up  loculations  Drainage: purulent  Drainage amount: copious  Patient tolerance: Patient tolerated the procedure well with no immediate complications.     Final Clinical Impressions(s) / ED Diagnoses   Final diagnoses:  None   Patient presents today with two separate complaints.  He is complaining of an abscess to the left axilla and also chest pain.  Abscess of the axilla incise and drained in the ED without complications.  He does have some surrounding cellulitis, therefore, placed on antibiotics.  No fever or signs of systemic symptoms.  Patient instructed to follow up in 2 days for recheck.    Pt also complaining of chest pain for the past couple of days.  He denies recent Cocaine use.  No ischemic changes on EKG.  Troponin negative.  He is PERC negative.  IV Drug use would put him at increased  risk of Endocarditis.  However, no fever, murmur, janeway lesions, osler nodes, or petechiae to suggest Endocarditis.  Feel that the patient is stable for discharge.  Strict return precautions given.   New Prescriptions New Prescriptions   No medications on file     Santiago GladHeather Karstyn Birkey, PA-C 11/18/15 1027    Benjiman CoreNathan Pickering, MD 11/18/15 [phone]

## 2016-03-20 ENCOUNTER — Emergency Department (HOSPITAL_COMMUNITY)
Admission: EM | Admit: 2016-03-20 | Discharge: 2016-03-20 | Discharge status: Absent without leave | Payer: Worker's Compensation

## 2016-03-20 NOTE — ED Notes (Signed)
Called pt for triage vitals no response.

## 2016-03-20 NOTE — ED Notes (Signed)
Patient called for triage x3 

## 2016-03-20 NOTE — ED Notes (Signed)
Patient called for triage x2 

## 2019-03-28 ENCOUNTER — Emergency Department (HOSPITAL_COMMUNITY): Payer: No Typology Code available for payment source

## 2019-03-28 ENCOUNTER — Emergency Department (HOSPITAL_COMMUNITY)
Admission: EM | Admit: 2019-03-28 | Discharge: 2019-03-29 | Disposition: A | Discharge status: Home / Self Care | Payer: No Typology Code available for payment source | Attending: Emergency Medicine | Admitting: Emergency Medicine

## 2019-03-28 DIAGNOSIS — S0101XA Laceration without foreign body of scalp, initial encounter: Secondary | ICD-10-CM | POA: Diagnosis present

## 2019-03-28 DIAGNOSIS — Y9389 Activity, other specified: Secondary | ICD-10-CM | POA: Diagnosis not present

## 2019-03-28 DIAGNOSIS — F17210 Nicotine dependence, cigarettes, uncomplicated: Secondary | ICD-10-CM | POA: Diagnosis not present

## 2019-03-28 DIAGNOSIS — Y999 Unspecified external cause status: Secondary | ICD-10-CM | POA: Diagnosis not present

## 2019-03-28 DIAGNOSIS — Z79899 Other long term (current) drug therapy: Secondary | ICD-10-CM | POA: Insufficient documentation

## 2019-03-28 DIAGNOSIS — Y92410 Unspecified street and highway as the place of occurrence of the external cause: Secondary | ICD-10-CM | POA: Insufficient documentation

## 2019-03-28 DIAGNOSIS — Z23 Encounter for immunization: Secondary | ICD-10-CM | POA: Insufficient documentation

## 2019-03-28 DIAGNOSIS — S27329A Contusion of lung, unspecified, initial encounter: Secondary | ICD-10-CM | POA: Insufficient documentation

## 2019-03-28 DIAGNOSIS — F1910 Other psychoactive substance abuse, uncomplicated: Secondary | ICD-10-CM | POA: Diagnosis not present

## 2019-03-28 LAB — CBC
HCT: 46.2 % (ref 39.0–52.0)
Hemoglobin: 15.8 g/dL (ref 13.0–17.0)
MCH: 30.4 pg (ref 26.0–34.0)
MCHC: 34.2 g/dL (ref 30.0–36.0)
MCV: 89 fL (ref 80.0–100.0)
Platelets: 216 10*3/uL (ref 150–400)
RBC: 5.19 MIL/uL (ref 4.22–5.81)
RDW: 12.1 % (ref 11.5–15.5)
WBC: 5.8 10*3/uL (ref 4.0–10.5)
nRBC: 0 % (ref 0.0–0.2)

## 2019-03-28 LAB — COMPREHENSIVE METABOLIC PANEL
ALT: 24 U/L (ref 0–44)
AST: 28 U/L (ref 15–41)
Albumin: 4.2 g/dL (ref 3.5–5.0)
Alkaline Phosphatase: 66 U/L (ref 38–126)
Anion gap: 7 (ref 5–15)
BUN: 14 mg/dL (ref 6–20)
CO2: 29 mmol/L (ref 22–32)
Calcium: 9.7 mg/dL (ref 8.9–10.3)
Chloride: 106 mmol/L (ref 98–111)
Creatinine, Ser: 1.12 mg/dL (ref 0.61–1.24)
GFR calc Af Amer: >60 mL/min (ref >60)
GFR calc non Af Amer: >60 mL/min (ref >60)
Glucose, Bld: 99 mg/dL (ref 70–99)
Potassium: 4.7 mmol/L (ref 3.5–5.1)
Sodium: 142 mmol/L (ref 135–145)
Total Bilirubin: 1.8 mg/dL — ABNORMAL HIGH (ref 0.3–1.2)
Total Protein: 7.1 g/dL (ref 6.5–8.1)

## 2019-03-28 LAB — URINALYSIS, ROUTINE W REFLEX MICROSCOPIC
Bilirubin Urine: NEGATIVE
Glucose, UA: NEGATIVE mg/dL
Hgb urine dipstick: NEGATIVE
Ketones, ur: NEGATIVE mg/dL
Leukocytes,Ua: NEGATIVE
Nitrite: NEGATIVE
Protein, ur: NEGATIVE mg/dL
Specific Gravity, Urine: >1.046 — ABNORMAL HIGH (ref 1.005–1.030)
pH: 6 (ref 5.0–8.0)

## 2019-03-28 LAB — SAMPLE TO BLOOD BANK

## 2019-03-28 LAB — I-STAT CHEM 8, ED
BUN: 15 mg/dL (ref 6–20)
Calcium, Ion: 1.2 mmol/L (ref 1.15–1.40)
Chloride: 103 mmol/L (ref 98–111)
Creatinine, Ser: 1 mg/dL (ref 0.61–1.24)
Glucose, Bld: 91 mg/dL (ref 70–99)
HCT: 43 % (ref 39.0–52.0)
Hemoglobin: 14.6 g/dL (ref 13.0–17.0)
Potassium: 3.6 mmol/L (ref 3.5–5.1)
Sodium: 140 mmol/L (ref 135–145)
TCO2: 28 mmol/L (ref 22–32)

## 2019-03-28 LAB — PROTIME-INR
INR: 1 (ref 0.8–1.2)
Prothrombin Time: 13.3 seconds (ref 11.4–15.2)

## 2019-03-28 LAB — CDS SEROLOGY

## 2019-03-28 LAB — ETHANOL: Alcohol, Ethyl (B): <10 mg/dL (ref <10)

## 2019-03-28 MED ORDER — IOHEXOL 300 MG/ML  SOLN
100.0000 mL | Freq: Once | INTRAMUSCULAR | Status: AC | PRN
  Administered 2019-03-28: 18:00:00 100 mL via INTRAVENOUS

## 2019-03-28 MED ORDER — TETANUS-DIPHTH-ACELL PERTUSSIS 5-2.5-18.5 LF-MCG/0.5 IM SUSP
0.5000 mL | Freq: Once | INTRAMUSCULAR | Status: AC
  Administered 2019-03-28: 19:00:00 0.5 mL via INTRAMUSCULAR
  Filled 2019-03-28: qty 0.5

## 2019-03-28 MED ORDER — SODIUM CHLORIDE 0.9 % IV BOLUS
1000.0000 mL | Freq: Once | INTRAVENOUS | Status: AC
  Administered 2019-03-28: 18:00:00 1000 mL via INTRAVENOUS

## 2019-03-28 MED ORDER — SODIUM CHLORIDE 0.9 % IV SOLN: INTRAVENOUS | Status: DC

## 2019-03-28 NOTE — ED Notes (Signed)
Per house coverage-- pt's mother is a Engineer, civil (consulting) at [employer]-- has called to check on pt- he will follow up with her.
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
## 2019-03-28 NOTE — ED Triage Notes (Signed)
Mother called for update. Please call  [phone]

## 2019-03-28 NOTE — ED Notes (Signed)
Patient transported to CT 
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
## 2019-03-28 NOTE — ED Notes (Signed)
Pt woke up for a brief time during in and out cath for urine. Stated he didn't know how he got here. Informed pt of situation

## 2019-03-28 NOTE — ED Notes (Signed)
Pt awakes when calling his name, but only grunts when asked yes or no questions. Not verbal when asked where he is at

## 2019-03-28 NOTE — ED Triage Notes (Signed)
Father of Pt  Called with update on Pt condition.

## 2019-03-28 NOTE — ED Notes (Signed)
Pt opened up eyes when explaining this RN needed to check his temp, but pt unable to follow commands clearly when asked to open mouth and place thermometer under the tongue.

## 2019-03-29 LAB — RAPID URINE DRUG SCREEN, HOSP PERFORMED
Amphetamines: POSITIVE — AB
Barbiturates: NOT DETECTED
Benzodiazepines: POSITIVE — AB
Cocaine: POSITIVE — AB
Opiates: POSITIVE — AB
Tetrahydrocannabinol: POSITIVE — AB

## 2019-03-29 NOTE — ED Notes (Signed)
WOW computer at bedside esignature not working.

## 2019-03-29 NOTE — ED Notes (Signed)
Called pt's father and pt spoke with him. Stated he would come pick the pt up

## 2019-03-29 NOTE — ED Provider Notes (Addendum)
1:53 AM Patient gives verbal consent for me to reveal all lab and studies to mother including UDS.  He states that his mother already "knows everything."   I discussed the patient's workup and test results with the patient and with his mother.  His mother will come to pick up the patient.  Patient given aspen collar and given instructions to follow-up with PCP/ortho.  No trauma to the head or spine.  Collar applied due to intoxication and can be cleared by PCP when sober.   Minimal pulmonary contusion.  O2 sat 100%.  RR is normal.    Still sobering up, but speaking in complete sentences.  GCS 15. Stable for discharge home with his mother.    Roxy Horseman, PA-C 03/29/19 Ardelle Lesches, MD 03/29/19 0252    Roxy Horseman, PA-C 03/29/19 3254    Zadie Rhine, MD 03/29/19 [phone]

## 2019-03-29 NOTE — ED Notes (Signed)
[name] mother [phone] looking for an update on the patient

## 2019-03-29 NOTE — Discharge Instructions (Addendum)
You did not have any serious traumatic injuries on your imaging studies today.  You are lucky to have not suffered a severe injury.  Do not drive while under the influence of any substance or medication.

## 2020-07-04 ENCOUNTER — Emergency Department: Payer: Self-pay

## 2020-07-04 ENCOUNTER — Encounter: Payer: Self-pay | Admitting: Emergency Medicine

## 2020-07-04 ENCOUNTER — Other Ambulatory Visit: Payer: Self-pay

## 2020-07-04 ENCOUNTER — Emergency Department
Admission: EM | Admit: 2020-07-04 | Discharge: 2020-07-04 | Disposition: A | Discharge status: Home / Self Care | Payer: Self-pay | Attending: Emergency Medicine | Admitting: Emergency Medicine

## 2020-07-04 DIAGNOSIS — F17210 Nicotine dependence, cigarettes, uncomplicated: Secondary | ICD-10-CM | POA: Insufficient documentation

## 2020-07-04 DIAGNOSIS — Z7982 Long term (current) use of aspirin: Secondary | ICD-10-CM | POA: Insufficient documentation

## 2020-07-04 DIAGNOSIS — W260XXA Contact with knife, initial encounter: Secondary | ICD-10-CM | POA: Insufficient documentation

## 2020-07-04 DIAGNOSIS — S61215A Laceration without foreign body of left ring finger without damage to nail, initial encounter: Secondary | ICD-10-CM | POA: Insufficient documentation

## 2020-07-04 MED ORDER — BACITRACIN ZINC 500 UNIT/GM EX OINT
TOPICAL_OINTMENT | Freq: Once | CUTANEOUS | Status: AC
  Administered 2020-07-04: 1 via TOPICAL
  Filled 2020-07-04: qty 0.9

## 2020-07-04 MED ORDER — DOXYCYCLINE HYCLATE 100 MG PO TABS
100.0000 mg | ORAL_TABLET | Freq: Once | ORAL | Status: AC
  Administered 2020-07-04: 100 mg via ORAL
  Filled 2020-07-04: qty 1

## 2020-07-04 MED ORDER — DOXYCYCLINE HYCLATE 50 MG PO CAPS: 100.0000 mg | ORAL_CAPSULE | Freq: Two times a day (BID) | ORAL | 0 refills | Status: AC

## 2020-07-04 NOTE — Discharge Instructions (Addendum)
1.  Take antibiotic as prescribed (doxycycline 100 mg twice daily x7 days). 2.  Keep wound clean and dry. 3.  Return to the ER for worsening symptoms, increased redness/swelling, purulent discharge or other concerns.
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
## 2020-07-04 NOTE — ED Provider Notes (Signed)
[facility] Emergency Department Provider Note   ____________________________________________   Event Date/Time   First MD Initiated Contact with Patient 07/04/20 [phone]     (approximate)  I have reviewed the triage vital signs and the nursing notes.   HISTORY  Chief Complaint Laceration    HPI [name] is a 35 y.o. male who presents to the ED from home with a chief complaint of left finger laceration.  Patient was forcing a knife into his sheath and cut his index finger.  Presents with a small linear laceration to the palmar side of his left, nondominant index finger without active bleeding.  Tetanus is up-to-date.  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     Past Medical History:  Diagnosis Date  . Polysubstance abuse (HCC)     There are no problems to display for this patient.   Past Surgical History:  Procedure Laterality Date  . HAND SURGERY     L hand  . KIDNEY SURGERY      Prior to Admission medications   Medication Sig Start Date End Date Taking? Authorizing Provider  acetaminophen (TYLENOL) 500 MG tablet Take 1,000 mg by mouth every 6 (six) hours as needed for moderate pain.    [provider]  albuterol (PROVENTIL HFA;VENTOLIN HFA) 108 (90 Base) MCG/ACT inhaler Inhale 2 puffs into the lungs every 6 (six) hours as needed for wheezing or shortness of breath.    [provider]  aspirin 325 MG tablet Take 650 mg by mouth every 6 (six) hours as needed for moderate pain.    [provider]  cyclobenzaprine (FLEXERIL) 10 MG tablet Take 1 tablet (10 mg total) by mouth 2 (two) times daily as needed for muscle spasms. 03/04/15   Pisciotta, Joni Reining, PA-C  ibuprofen (ADVIL,MOTRIN) 200 MG tablet Take 400 mg by mouth every 6 (six) hours as needed for moderate pain (pain).    [provider]    Allergies Patient has no known allergies.  No family  history on file.  Social History Social History   Tobacco Use  . Smoking status: Current Some Day Smoker    Types: Cigarettes  . Smokeless tobacco: Never Used  Substance Use Topics  . Alcohol use: Yes    Comment: occasionally  . Drug use: Yes    Frequency: 2.0 times per week    Types: Marijuana    Review of Systems  Constitutional: No fever/chills Eyes: No visual changes. ENT: No sore throat. Cardiovascular: Denies chest pain. Respiratory: Denies shortness of breath. Gastrointestinal: No abdominal pain.  No nausea, no vomiting.  No diarrhea.  No constipation. Genitourinary: Negative for dysuria. Musculoskeletal: Positive for left index finger laceration.  Negative for back pain. Skin: Negative for rash. Neurological: Negative for headaches, focal weakness or numbness.   ____________________________________________   PHYSICAL EXAM:  VITAL SIGNS: ED Triage Vitals  Enc Vitals Group     BP 07/04/20 0539 (!) 113/95     Pulse Rate 07/04/20 0539 91     Resp 07/04/20 0539 18     Temp 07/04/20 0539 98.8 F (37.1 C)     Temp Source 07/04/20 0539 Oral     SpO2 07/04/20 0539 99 %     Weight 07/04/20 0534 185 lb (83.9 kg)     Height 07/04/20 0534 5\' 11"  (1.803 m)     Head Circumference --      Peak Flow --  Pain Score 07/04/20 0534 6     Pain Loc --      Pain Edu? --      Excl. in GC? --     Constitutional: Alert and oriented. Well appearing and in no acute distress. Eyes: Conjunctivae are normal. PERRL. EOMI. Head: Atraumatic. Nose: No congestion/rhinnorhea. Mouth/Throat: Mucous membranes are moist.  Oropharynx non-erythematous. Neck: No stridor.   Cardiovascular: Normal rate, regular rhythm. Grossly normal heart sounds.  Good peripheral circulation. Respiratory: Normal respiratory effort.  No retractions. Lungs CTAB. Gastrointestinal: Soft and nontender. No distention. No abdominal bruits. No CVA tenderness. Musculoskeletal:  Left index finger: 0.5 cm  horizontally linear and superficial laceration to proximal finger near palm.  No active bleeding. No lower extremity tenderness nor edema.  No joint effusions. Neurologic:  Normal speech and language. No gross focal neurologic deficits are appreciated. No gait instability. Skin:  Skin is warm, dry and intact. No rash noted. Psychiatric: Mood and affect are normal. Speech and behavior are normal.  ____________________________________________   LABS (all labs ordered are listed, but only abnormal results are displayed)  Labs Reviewed - No data to display ____________________________________________  EKG  None ____________________________________________  RADIOLOGY I, Mihailo Sage J, personally viewed and evaluated these images (plain radiographs) as part of my medical decision making, as well as reviewing the written report by the radiologist.  ED MD interpretation: None  Official radiology report(s): No results found.  ____________________________________________   PROCEDURES  Procedure(s) performed (including Critical Care):  Procedures   ____________________________________________   INITIAL IMPRESSION / ASSESSMENT AND PLAN / ED COURSE  As part of my medical decision making, I reviewed the following data within the electronic MEDICAL RECORD NUMBER Nursing notes reviewed and incorporated and Notes from prior ED visits     35 year old male presenting with laceration to left index finger.  Will place finger in Betadine+NS soak.  Bacitracin and nonadherent dressing.  Place on Doxycycline for probable contaminated wound.  Strict return precautions given.  Patient verbalizes understanding agrees with plan of care.      ____________________________________________   FINAL CLINICAL IMPRESSION(S) / ED DIAGNOSES  Final diagnoses:  Laceration of left ring finger without foreign body without damage to nail, initial encounter     ED Discharge Orders    None      *Please  note:  [name] was evaluated in Emergency Department on 07/04/2020 for the symptoms described in the history of present illness. He was evaluated in the context of the global COVID-19 pandemic, which necessitated consideration that the patient might be at risk for infection with the SARS-CoV-2 virus that causes COVID-19. Institutional protocols and algorithms that pertain to the evaluation of patients at risk for COVID-19 are in a state of rapid change based on information released by regulatory bodies including the CDC and federal and state organizations. These policies and algorithms were followed during the patient's care in the ED.  Some ED evaluations and interventions may be delayed as a result of limited staffing during and the pandemic.*   Note:  This document was prepared using Dragon voice recognition software and may include unintentional dictation errors.   Irean Hong, MD 07/04/20 [phone]
